# Patient Record
Sex: Female | Born: 1990 | Race: White | Hispanic: No | Marital: Single | Smoking: Never smoker
Health system: Southern US, Community
[De-identification: ages and names within clinical notes are randomized; demographics above are authoritative.]

## PROBLEM LIST (undated history)

## (undated) ENCOUNTER — Inpatient Hospital Stay (HOSPITAL_COMMUNITY): Payer: Self-pay

## (undated) DIAGNOSIS — T7840XA Allergy, unspecified, initial encounter: Secondary | ICD-10-CM

## (undated) DIAGNOSIS — F411 Generalized anxiety disorder: Secondary | ICD-10-CM

## (undated) DIAGNOSIS — E079 Disorder of thyroid, unspecified: Secondary | ICD-10-CM

## (undated) DIAGNOSIS — Z87442 Personal history of urinary calculi: Secondary | ICD-10-CM

## (undated) DIAGNOSIS — F32A Depression, unspecified: Secondary | ICD-10-CM

## (undated) DIAGNOSIS — R569 Unspecified convulsions: Secondary | ICD-10-CM

## (undated) DIAGNOSIS — Q872 Congenital malformation syndromes predominantly involving limbs: Secondary | ICD-10-CM

## (undated) DIAGNOSIS — G4730 Sleep apnea, unspecified: Secondary | ICD-10-CM

## (undated) HISTORY — DX: Depression, unspecified: F32.A

## (undated) HISTORY — DX: Allergy, unspecified, initial encounter: T78.40XA

## (undated) HISTORY — PX: FOOT SURGERY: SHX648

## (undated) HISTORY — PX: COSMETIC SURGERY: SHX468

## (undated) HISTORY — PX: THROAT SURGERY: SHX803

## (undated) HISTORY — DX: Disorder of thyroid, unspecified: E07.9

## (undated) HISTORY — DX: Generalized anxiety disorder: F41.1

## (undated) HISTORY — DX: Unspecified convulsions: R56.9

## (undated) HISTORY — PX: TONSILLECTOMY: SUR1361

---
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
## 2014-04-05 ENCOUNTER — Emergency Department: Payer: Self-pay | Admitting: Emergency Medicine

## 2014-04-05 LAB — COMPREHENSIVE METABOLIC PANEL
ALBUMIN: 3 g/dL — AB (ref 3.4–5.0)
AST: 18 U/L (ref 15–37)
Alkaline Phosphatase: 35 U/L — ABNORMAL LOW
Anion Gap: 11 (ref 7–16)
BILIRUBIN TOTAL: 0.4 mg/dL (ref 0.2–1.0)
BUN: 11 mg/dL (ref 7–18)
CHLORIDE: 107 mmol/L (ref 98–107)
CREATININE: 0.7 mg/dL (ref 0.60–1.30)
Calcium, Total: 8.1 mg/dL — ABNORMAL LOW (ref 8.5–10.1)
Co2: 23 mmol/L (ref 21–32)
EGFR (African American): >60
GLUCOSE: 83 mg/dL (ref 65–99)
OSMOLALITY: 280 (ref 275–301)
POTASSIUM: 4.5 mmol/L (ref 3.5–5.1)
SGPT (ALT): 10 U/L — ABNORMAL LOW
SODIUM: 141 mmol/L (ref 136–145)
TOTAL PROTEIN: 6.3 g/dL — AB (ref 6.4–8.2)

## 2014-04-05 LAB — URINALYSIS, COMPLETE
Bilirubin,UR: NEGATIVE
Blood: NEGATIVE
Glucose,UR: NEGATIVE mg/dL (ref 0–75)
Ketone: NEGATIVE
LEUKOCYTE ESTERASE: NEGATIVE
Nitrite: NEGATIVE
PROTEIN: NEGATIVE
Ph: 6 (ref 4.5–8.0)
RBC,UR: 1 /HPF (ref 0–5)
SPECIFIC GRAVITY: 1.015 (ref 1.003–1.030)
Squamous Epithelial: 4
WBC UR: 1 /HPF (ref 0–5)

## 2014-04-05 LAB — CBC WITH DIFFERENTIAL/PLATELET
BASOS PCT: 0.6 %
Basophil #: 0 10*3/uL (ref 0.0–0.1)
EOS PCT: 1.2 %
Eosinophil #: 0.1 10*3/uL (ref 0.0–0.7)
HCT: 31.6 % — ABNORMAL LOW (ref 35.0–47.0)
HGB: 10.4 g/dL — ABNORMAL LOW (ref 12.0–16.0)
Lymphocyte #: 0.8 10*3/uL — ABNORMAL LOW (ref 1.0–3.6)
Lymphocyte %: 13.4 %
MCH: 31.1 pg (ref 26.0–34.0)
MCHC: 33 g/dL (ref 32.0–36.0)
MCV: 94 fL (ref 80–100)
MONO ABS: 0.4 x10 3/mm (ref 0.2–0.9)
MONOS PCT: 7.1 %
Neutrophil #: 4.4 10*3/uL (ref 1.4–6.5)
Neutrophil %: 77.7 %
PLATELETS: 129 10*3/uL — AB (ref 150–440)
RBC: 3.35 10*6/uL — ABNORMAL LOW (ref 3.80–5.20)
RDW: 16.4 % — AB (ref 11.5–14.5)
WBC: 5.6 10*3/uL (ref 3.6–11.0)

## 2014-04-05 LAB — HCG, QUANTITATIVE, PREGNANCY: Beta Hcg, Quant.: 18624 m[IU]/mL — ABNORMAL HIGH

## 2014-04-05 LAB — LIPASE, BLOOD: Lipase: 101 U/L (ref 73–393)

## 2014-04-07 ENCOUNTER — Encounter: Payer: Self-pay | Admitting: Obstetrics and Gynecology

## 2014-04-22 ENCOUNTER — Observation Stay: Payer: Self-pay | Admitting: Obstetrics and Gynecology

## 2014-05-31 ENCOUNTER — Encounter (HOSPITAL_COMMUNITY): Payer: Self-pay | Admitting: *Deleted

## 2014-05-31 ENCOUNTER — Inpatient Hospital Stay (HOSPITAL_COMMUNITY): Payer: Medicaid Other

## 2014-05-31 ENCOUNTER — Inpatient Hospital Stay (HOSPITAL_COMMUNITY)
Admission: AD | Admit: 2014-05-31 | Discharge: 2014-05-31 | Disposition: A | Discharge status: Home / Self Care | Payer: Medicaid Other | Source: Ambulatory Visit | Attending: Obstetrics & Gynecology | Admitting: Obstetrics & Gynecology

## 2014-05-31 DIAGNOSIS — Q872 Congenital malformation syndromes predominantly involving limbs: Secondary | ICD-10-CM | POA: Diagnosis not present

## 2014-05-31 DIAGNOSIS — O9989 Other specified diseases and conditions complicating pregnancy, childbirth and the puerperium: Secondary | ICD-10-CM | POA: Diagnosis not present

## 2014-05-31 DIAGNOSIS — N200 Calculus of kidney: Secondary | ICD-10-CM | POA: Diagnosis not present

## 2014-05-31 DIAGNOSIS — R109 Unspecified abdominal pain: Secondary | ICD-10-CM | POA: Diagnosis present

## 2014-05-31 HISTORY — DX: Congenital malformation syndromes predominantly involving limbs: Q87.2

## 2014-05-31 HISTORY — DX: Sleep apnea, unspecified: G47.30

## 2014-05-31 LAB — URINALYSIS, ROUTINE W REFLEX MICROSCOPIC
Bilirubin Urine: NEGATIVE
GLUCOSE, UA: NEGATIVE mg/dL
KETONES UR: NEGATIVE mg/dL
Leukocytes, UA: NEGATIVE
Nitrite: NEGATIVE
PROTEIN: NEGATIVE mg/dL
Specific Gravity, Urine: 1.01 (ref 1.005–1.030)
UROBILINOGEN UA: 0.2 mg/dL (ref 0.0–1.0)
pH: 6.5 (ref 5.0–8.0)

## 2014-05-31 LAB — URINE MICROSCOPIC-ADD ON

## 2014-05-31 LAB — CBC
HCT: 29.9 % — ABNORMAL LOW (ref 36.0–46.0)
Hemoglobin: 9.8 g/dL — ABNORMAL LOW (ref 12.0–15.0)
MCH: 31.1 pg (ref 26.0–34.0)
MCHC: 32.8 g/dL (ref 30.0–36.0)
MCV: 94.9 fL (ref 78.0–100.0)
PLATELETS: 122 10*3/uL — AB (ref 150–400)
RBC: 3.15 MIL/uL — ABNORMAL LOW (ref 3.87–5.11)
RDW: 15.6 % — AB (ref 11.5–15.5)
WBC: 9 10*3/uL (ref 4.0–10.5)

## 2014-05-31 MED ORDER — HYDROMORPHONE HCL 1 MG/ML IJ SOLN
1.0000 mg | INTRAMUSCULAR | Status: DC | PRN
  Administered 2014-05-31: 1 mg via INTRAVENOUS
  Filled 2014-05-31: qty 1

## 2014-05-31 MED ORDER — HYDROMORPHONE HCL 2 MG PO TABS: 2.0000 mg | ORAL_TABLET | Freq: Four times a day (QID) | ORAL | Status: DC | PRN

## 2014-05-31 MED ORDER — SODIUM CHLORIDE 0.9 % IV SOLN
Freq: Once | INTRAVENOUS | Status: AC
  Administered 2014-05-31: 14:00:00 via INTRAVENOUS

## 2014-05-31 NOTE — Discharge Instructions (Signed)

## 2014-05-31 NOTE — MAU Note (Signed)
Pt states that at 7am this morning, she had severe sharp pain that started in her back (right side), and then radiated to her stomach. Rates pain 8/10. Pt also felt dizzy and nausated and "spacey".  Denies any LOF and VB. +FM.

## 2014-05-31 NOTE — MAU Provider Note (Signed)
History     CSN: [id number]  Arrival date and time: 05/31/14 1107   First Provider Initiated Contact with Patient 05/31/14 1148      Chief Complaint  Patient presents with  . Back Pain  . Abdominal Pain   Back Pain Associated symptoms include abdominal pain (intermittent).  Abdominal Pain    Pt is 23 yo G1P0 at [redacted]w[redacted]d wks IUP here with report of sharp pain in abdomen at 7 am today.  Pain t started in her back (right side), and then radiated to her stomach. Rates pain 8/10. Pt also felt dizzy and nausated and "spacey". Denies any LOF and VB. +FM.     Pt states that adnexal cyst found on renal ultrasound was found at [facility] and they're going to follow-up later in pregnancy.  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    Past Medical History  Diagnosis Date  . Klippel-Trenaunay-Weber syndrome   . Sleep apnea     Past Surgical History  Procedure Laterality Date  . Tonsillectomy    . Foot surgery Bilateral   . Throat surgery      History reviewed. No pertinent family history.  History  Substance Use Topics  . Smoking status: Never Smoker   . Smokeless tobacco: Not on file  . Alcohol Use: No    Allergies: No Known Allergies  Prescriptions prior to admission  Medication Sig Dispense Refill  . enoxaparin (LOVENOX) 30 MG/0.3ML injection Inject 30 mg into the skin daily.      . ferrous sulfate 325 (65 FE) MG tablet Take 325 mg by mouth daily with breakfast.      . Prenatal Vit-Fe Fumarate-FA (PRENATAL MULTIVITAMIN) TABS tablet Take 1 tablet by mouth daily at 12 noon.        Review of Systems  Gastrointestinal: Positive for abdominal pain (intermittent).  Musculoskeletal: Positive for back pain (constant).  All other systems reviewed and are negative.  Physical Exam   Blood pressure 100/62, pulse 88, temperature 98.6 F (37 C), temperature source Oral, resp. rate 20, height 5\' 2"  (1.575 m), weight 77.111 kg (170  lb).  Physical Exam  Constitutional: She is oriented to person, place, and time. She appears well-developed and well-nourished. She appears distressed (appears uncomfortable).  HENT:  Head: Normocephalic.  Neck: Normal range of motion. Neck supple.  Cardiovascular: Normal rate, regular rhythm and normal heart sounds.   Respiratory: Effort normal and breath sounds normal.  GI: Soft. There is no tenderness.  Genitourinary: No bleeding around the vagina. Vaginal discharge (mucusy) found.  Musculoskeletal: Normal range of motion.  Neurological: She is alert and oriented to person, place, and time.  Skin: Skin is warm and dry.   FHR 130's, +accels Toco - none  MAU Course  Procedures Results for orders placed during the hospital encounter of 05/31/14 (from the past 24 hour(s))  URINALYSIS, ROUTINE W REFLEX MICROSCOPIC     Status: Abnormal   Collection Time    05/31/14 12:00 PM      Result Value Ref Range   Color, Urine YELLOW  YELLOW   APPearance CLEAR  CLEAR   Specific Gravity, Urine 1.010  1.005 - 1.030   pH 6.5  5.0 - 8.0   Glucose, UA NEGATIVE  NEGATIVE mg/dL   Hgb urine dipstick LARGE (*) NEGATIVE   Bilirubin Urine NEGATIVE  NEGATIVE   Ketones, ur NEGATIVE  NEGATIVE mg/dL   Protein, ur NEGATIVE  NEGATIVE mg/dL   Urobilinogen, UA 0.2  0.0 - 1.0 mg/dL   Nitrite NEGATIVE  NEGATIVE   Leukocytes, UA NEGATIVE  NEGATIVE  URINE MICROSCOPIC-ADD ON     Status: None   Collection Time    05/31/14 12:00 PM      Result Value Ref Range   Squamous Epithelial / LPF RARE  RARE   WBC, UA 0-2  <3 WBC/hpf   RBC / HPF 7-10  <3 RBC/hpf  CBC     Status: Abnormal   Collection Time    05/31/14 12:06 PM      Result Value Ref Range   WBC 9.0  4.0 - 10.5 K/uL   RBC 3.15 (*) 3.87 - 5.11 MIL/uL   Hemoglobin 9.8 (*) 12.0 - 15.0 g/dL   HCT 16.129.9 (*) 09.636.0 - 04.546.0 %   MCV 94.9  78.0 - 100.0 fL   MCH 31.1  26.0 - 34.0 pg   MCHC 32.8  30.0 - 36.0 g/dL   RDW 40.915.6 (*) 81.111.5 - 91.415.5 %   Platelets 122 (*)  150 - 400 K/uL   EXAM:  RENAL/URINARY TRACT ULTRASOUND COMPLETE  COMPARISON: None.  FINDINGS:  Right Kidney:  Length: 13.6 cm. 4 mm midpole stone without obstruction.  Echogenicity within normal limits. No mass or hydronephrosis  visualized.  Left Kidney:  Length: 11.6 cm. Echogenicity within normal limits. No mass or  hydronephrosis visualized.  Bladder:  Appears normal for degree of bladder distention. Bilateral ureteral  jets are visualized by Doppler.  Enlarged spleen measuring 16.5 cm in length.  Gravid uterus. 9.7 cm cyst in the left adnexal lateral to the  bladder is predominately anechoic.  IMPRESSION:  4 mm nonobstructing midpole stone on the right. Both kidneys appear  normal  Splenomegaly  9.7 cm left adnexal cyst.  1500 Pt reports passing two small stones in toilet while urinating; pain slightly relieved  Pt received 1 L NS and 1 mg IV dilaudid > reports relief  1640 Reviewed HPI/exam/medical history/ultrasound/labs with Dr. 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 Schedule appointment with [facility] this week.  Rochele PagesKARIM, WALIDAH N 05/31/2014, 11:48 AM

## 2014-06-18 ENCOUNTER — Encounter (HOSPITAL_COMMUNITY): Payer: Self-pay | Admitting: *Deleted

## 2014-06-18 ENCOUNTER — Inpatient Hospital Stay (HOSPITAL_COMMUNITY)
Admission: AD | Admit: 2014-06-18 | Discharge: 2014-06-19 | Disposition: A | Discharge status: Home / Self Care | Payer: Medicaid Other | Source: Ambulatory Visit | Attending: Obstetrics & Gynecology | Admitting: Obstetrics & Gynecology

## 2014-06-18 ENCOUNTER — Emergency Department: Payer: Self-pay | Admitting: Emergency Medicine

## 2014-06-18 DIAGNOSIS — K921 Melena: Secondary | ICD-10-CM

## 2014-06-18 DIAGNOSIS — O468X3 Other antepartum hemorrhage, third trimester: Secondary | ICD-10-CM | POA: Insufficient documentation

## 2014-06-18 DIAGNOSIS — O99891 Other specified diseases and conditions complicating pregnancy: Secondary | ICD-10-CM

## 2014-06-18 DIAGNOSIS — M549 Dorsalgia, unspecified: Secondary | ICD-10-CM

## 2014-06-18 DIAGNOSIS — Z3A29 29 weeks gestation of pregnancy: Secondary | ICD-10-CM | POA: Diagnosis not present

## 2014-06-18 DIAGNOSIS — O9989 Other specified diseases and conditions complicating pregnancy, childbirth and the puerperium: Secondary | ICD-10-CM

## 2014-06-18 DIAGNOSIS — Q872 Congenital malformation syndromes predominantly involving limbs: Secondary | ICD-10-CM

## 2014-06-18 LAB — CBC
HCT: 32.1 % — AB (ref 35.0–47.0)
HCT: 30.1 % — ABNORMAL LOW (ref 35.0–47.0)
HCT: 30.7 % — ABNORMAL LOW (ref 36.0–46.0)
HEMOGLOBIN: 10 g/dL — AB (ref 12.0–15.0)
HGB: 10.6 g/dL — ABNORMAL LOW (ref 12.0–16.0)
HGB: 9.9 g/dL — ABNORMAL LOW (ref 12.0–16.0)
MCH: 31.6 pg (ref 26.0–34.0)
MCH: 31.4 pg (ref 26.0–34.0)
MCH: 31.6 pg (ref 26.0–34.0)
MCHC: 32.9 g/dL (ref 32.0–36.0)
MCHC: 32.8 g/dL (ref 32.0–36.0)
MCHC: 32.6 g/dL (ref 30.0–36.0)
MCV: 96 fL (ref 80–100)
MCV: 96 fL (ref 80–100)
MCV: 97.2 fL (ref 78.0–100.0)
PLATELETS: 124 10*3/uL — AB (ref 150–440)
Platelet: 121 10*3/uL — ABNORMAL LOW (ref 150–440)
Platelets: 133 10*3/uL — ABNORMAL LOW (ref 150–400)
RBC: 3.34 10*6/uL — ABNORMAL LOW (ref 3.80–5.20)
RBC: 3.15 10*6/uL — AB (ref 3.80–5.20)
RBC: 3.16 MIL/uL — ABNORMAL LOW (ref 3.87–5.11)
RDW: 15.9 % — ABNORMAL HIGH (ref 11.5–14.5)
RDW: 16.2 % — AB (ref 11.5–14.5)
RDW: 16.7 % — ABNORMAL HIGH (ref 11.5–15.5)
WBC: 6.9 10*3/uL (ref 3.6–11.0)
WBC: 6.8 10*3/uL (ref 3.6–11.0)
WBC: 7.6 10*3/uL (ref 4.0–10.5)

## 2014-06-18 LAB — URINALYSIS, ROUTINE W REFLEX MICROSCOPIC
BILIRUBIN URINE: NEGATIVE
GLUCOSE, UA: NEGATIVE mg/dL
Hgb urine dipstick: NEGATIVE
KETONES UR: 15 mg/dL — AB
Leukocytes, UA: NEGATIVE
Nitrite: NEGATIVE
Protein, ur: NEGATIVE mg/dL
Specific Gravity, Urine: 1.025 (ref 1.005–1.030)
Urobilinogen, UA: 0.2 mg/dL (ref 0.0–1.0)
pH: 6.5 (ref 5.0–8.0)

## 2014-06-18 LAB — HCG, QUANTITATIVE, PREGNANCY: Beta Hcg, Quant.: 18233 m[IU]/mL — ABNORMAL HIGH

## 2014-06-18 MED ORDER — TRAMADOL HCL 50 MG PO TABS
100.0000 mg | ORAL_TABLET | Freq: Once | ORAL | Status: AC
  Administered 2014-06-19: 100 mg via ORAL
  Filled 2014-06-18: qty 2

## 2014-06-18 NOTE — MAU Note (Addendum)
Last night had BM and passed a lot of blood rectally. Happened again about 1000 this am. WEnt to [place] about 1400 and there til 2030. WEnt to BR at home and BM and it was black. Having a lot of abd and back pain. I have rare syndrome and am [facility].  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 Was here couple wks ago for kidney stones. Passes some before i was seen but told had another one to pass but never did as far as i know.

## 2014-06-19 DIAGNOSIS — Q872 Congenital malformation syndromes predominantly involving limbs: Secondary | ICD-10-CM

## 2014-06-19 DIAGNOSIS — O26893 Other specified pregnancy related conditions, third trimester: Secondary | ICD-10-CM

## 2014-06-19 DIAGNOSIS — M549 Dorsalgia, unspecified: Secondary | ICD-10-CM

## 2014-06-19 MED ORDER — ONDANSETRON HCL 4 MG PO TABS: 4.0000 mg | ORAL_TABLET | Freq: Three times a day (TID) | ORAL | Status: DC | PRN

## 2014-06-19 MED ORDER — TRAMADOL HCL 50 MG PO TABS: 50.0000 mg | ORAL_TABLET | Freq: Four times a day (QID) | ORAL | Status: DC | PRN

## 2014-06-19 MED ORDER — ONDANSETRON 4 MG PO TBDP
4.0000 mg | ORAL_TABLET | Freq: Once | ORAL | Status: AC
  Administered 2014-06-19: 4 mg via ORAL
  Filled 2014-06-19: qty 1

## 2014-06-19 NOTE — Discharge Instructions (Signed)
Third Trimester of Pregnancy The third trimester is from week 29 through week 42, months 7 through 9. The third trimester is a time when the fetus is growing rapidly. At the end of the ninth month, the fetus is about 20 inches in length and weighs 6-10 pounds.  BODY CHANGES Your body goes through many changes during pregnancy. The changes vary from woman to woman.   Your weight will continue to increase. You can expect to gain 25-35 pounds (11-16 kg) by the end of the pregnancy.  You may begin to get stretch marks on your hips, abdomen, and breasts.  You may urinate more often because the fetus is moving lower into your pelvis and pressing on your bladder.  You may develop or continue to have heartburn as a result of your pregnancy.  You may develop constipation because certain hormones are causing the muscles that push waste through your intestines to slow down.  You may develop hemorrhoids or swollen, bulging veins (varicose veins).  You may have pelvic pain because of the weight gain and pregnancy hormones relaxing your joints between the bones in your pelvis. Backaches may result from overexertion of the muscles supporting your posture.  You may have changes in your hair. These can include thickening of your hair, rapid growth, and changes in texture. Some women also have hair loss during or after pregnancy, or hair that feels dry or thin. Your hair will most likely return to normal after your baby is born.  Your breasts will continue to grow and be tender. A yellow discharge may leak from your breasts called colostrum.  Your belly button may stick out.  You may feel short of breath because of your expanding uterus.  You may notice the fetus "dropping," or moving lower in your abdomen.  You may have a bloody mucus discharge. This usually occurs a few days to a week before labor begins.  Your cervix becomes thin and soft (effaced) near your due date. WHAT TO EXPECT AT YOUR PRENATAL  EXAMS  You will have prenatal exams every 2 weeks until week 36. Then, you will have weekly prenatal exams. During a routine prenatal visit:  You will be weighed to make sure you and the fetus are growing normally.  Your blood pressure is taken.  Your abdomen will be measured to track your baby's growth.  The fetal heartbeat will be listened to.  Any test results from the previous visit will be discussed.  You may have a cervical check near your due date to see if you have effaced. At around 36 weeks, your caregiver will check your cervix. At the same time, your caregiver will also perform a test on the secretions of the vaginal tissue. This test is to determine if a type of bacteria, Group B streptococcus, is present. Your caregiver will explain this further. Your caregiver may ask you:  What your birth plan is.  How you are feeling.  If you are feeling the baby move.  If you have had any abnormal symptoms, such as leaking fluid, bleeding, severe headaches, or abdominal cramping.  If you have any questions. Other tests or screenings that may be performed during your third trimester include:  Blood tests that check for low iron levels (anemia).  Fetal testing to check the health, activity level, and growth of the fetus. Testing is done if you have certain medical conditions or if there are problems during the pregnancy. FALSE LABOR You may feel small, irregular contractions that   eventually go away. These are called Braxton Hicks contractions, or false labor. Contractions may last for hours, days, or even weeks before true labor sets in. If contractions come at regular intervals, intensify, or become painful, it is best to be seen by your caregiver.  SIGNS OF LABOR   Menstrual-like cramps.  Contractions that are 5 minutes apart or less.  Contractions that start on the top of the uterus and spread down to the lower abdomen and back.  A sense of increased pelvic pressure or back  pain.  A watery or bloody mucus discharge that comes from the vagina. If you have any of these signs before the 37th week of pregnancy, call your caregiver right away. You need to go to the hospital to get checked immediately. HOME CARE INSTRUCTIONS   Avoid all smoking, herbs, alcohol, and unprescribed drugs. These chemicals affect the formation and growth of the baby.  Follow your caregiver's instructions regarding medicine use. There are medicines that are either safe or unsafe to take during pregnancy.  Exercise only as directed by your caregiver. Experiencing uterine cramps is a good sign to stop exercising.  Continue to eat regular, healthy meals.  Wear a good support bra for breast tenderness.  Do not use hot tubs, steam rooms, or saunas.  Wear your seat belt at all times when driving.  Avoid raw meat, uncooked cheese, cat litter boxes, and soil used by cats. These carry germs that can cause birth defects in the baby.  Take your prenatal vitamins.  Try taking a stool softener (if your caregiver approves) if you develop constipation. Eat more high-fiber foods, such as fresh vegetables or fruit and whole grains. Drink plenty of fluids to keep your urine clear or pale yellow.  Take warm sitz baths to soothe any pain or discomfort caused by hemorrhoids. Use hemorrhoid cream if your caregiver approves.  If you develop varicose veins, wear support hose. Elevate your feet for 15 minutes, 3-4 times a day. Limit salt in your diet.  Avoid heavy lifting, wear low heal shoes, and practice good posture.  Rest a lot with your legs elevated if you have leg cramps or low back pain.  Visit your dentist if you have not gone during your pregnancy. Use a soft toothbrush to brush your teeth and be gentle when you floss.  A sexual relationship may be continued unless your caregiver directs you otherwise.  Do not travel far distances unless it is absolutely necessary and only with the approval  of your caregiver.  Take prenatal classes to understand, practice, and ask questions about the labor and delivery.  Make a trial run to the hospital.  Pack your hospital bag.  Prepare the baby's nursery.  Continue to go to all your prenatal visits as directed by your caregiver. SEEK MEDICAL CARE IF:  You are unsure if you are in labor or if your water has broken.  You have dizziness.  You have mild pelvic cramps, pelvic pressure, or nagging pain in your abdominal area.  You have persistent nausea, vomiting, or diarrhea.  You have a bad smelling vaginal discharge.  You have pain with urination. SEEK IMMEDIATE MEDICAL CARE IF:   You have a fever.  You are leaking fluid from your vagina.  You have spotting or bleeding from your vagina.  You have severe abdominal cramping or pain.  You have rapid weight loss or gain.  You have shortness of breath with chest pain.  You notice sudden or extreme swelling   of your face, hands, ankles, feet, or legs.  You have not felt your baby move in over an hour.  You have severe headaches that do not go away with medicine.  You have vision changes. Document Released: 08/06/2001 Document Revised: 08/17/2013 Document Reviewed: 10/13/2012 Exit[employer] Patient Information 2015 [employer]. This information is not intended to replace advice given to you by your health care provider. Make sure you discuss any questions you have with your health care provider.  

## 2014-06-19 NOTE — MAU Provider Note (Signed)
History     CSN: [id number]  Arrival date and time: 06/18/14 2209   First Provider Initiated Contact with Patient 06/18/14 2312      Chief Complaint  Patient presents with  . Rectal Bleeding  . Back Pain  . Abdominal Pain   HPI   Frances Reed is a 23 y.o. G1P0 at 6345w0d who presents with rectal bleeding and low back pain. Patient presented to [facility] hospital for rectal bleeding earlier in the day, was diagnosed with hemorrhoidal bleeding and discharged home. At home she had another bowel movement with "darker" blood and presented to the MAU. Of note, patient is currently on Lovenox and has been diagnosed with Klippel-Trenaunay-Weber syndrome and [facility]. Also reports some dizziness when standing now.  She additionally reports sharp, constant back pain that started around 3 hours prior to arrival. She took some tylenol which did not help. Pain is currently an 8/10 and described as somewhat similar to her previous kidney stones diagnosed 2 weeks ago.  Denies any vaginal bleeding. No LOF. No contractions. Good fetal movement.   OB History   Grav Para Term Preterm Abortions TAB SAB Ect Mult Living   1               Past Medical History  Diagnosis Date  . Klippel-Trenaunay-Weber syndrome   . Sleep apnea     Past Surgical History  Procedure Laterality Date  . Tonsillectomy    . Foot surgery Bilateral   . Throat surgery      History reviewed. No pertinent family history.  History  Substance Use Topics  . Smoking status: Never Smoker   . Smokeless tobacco: Not on file  . Alcohol Use: No    Allergies: No Known Allergies  Prescriptions prior to admission  Medication Sig Dispense Refill  . enoxaparin (LOVENOX) 30 MG/0.3ML injection Inject 30 mg into the skin daily.      . ferrous sulfate 325 (65 FE) MG tablet Take 325 mg by mouth daily with breakfast.      . HYDROmorphone (DILAUDID) 2 MG tablet Take 1 tablet (2 mg total) by mouth every 6  (six) hours as needed for severe pain.  15 tablet  0  . Prenatal Vit-Fe Fumarate-FA (PRENATAL MULTIVITAMIN) TABS tablet Take 1 tablet by mouth daily at 12 noon.        Review of Systems  All other systems reviewed and are negative.  Physical Exam   Blood pressure 96/63, pulse 79, resp. rate 20, height 5\' 2"  (1.575 m), weight 84.46 kg (186 lb 3.2 oz), SpO2 94.00%.  Physical Exam  Nursing note and vitals reviewed. Constitutional: She is oriented to person, place, and time. She appears well-developed and well-nourished. No distress.  HENT:  Head: Normocephalic and atraumatic.  Eyes: EOM are normal.  Neck: Normal range of motion.  Cardiovascular: Regular rhythm and normal heart sounds.   No murmur heard. Respiratory: Effort normal and breath sounds normal. No respiratory distress. She has no wheezes.  GI: Soft. There is no tenderness.  Uterine size appropriate for gestational age  Musculoskeletal: Normal range of motion.  Neurological: She is alert and oriented to person, place, and time.  Skin: Skin is warm and dry.  FHT:  FHR: 130 bpm, variability: moderate,  accelerations:  present,  decelerations:  none   Orthostatic VS for the past 24 hrs:  BP- Lying Pulse- Lying BP- Sitting Pulse- Sitting BP- Standing at 0 minutes Pulse- Standing at  0 minutes  06/18/14 2328 110/60 mmHg 90 106/61 mmHg 95 96/63 mmHg 133      Procedures-None  Results for orders placed during the hospital encounter of 06/18/14 (from the past 24 hour(s))  URINALYSIS, ROUTINE W REFLEX MICROSCOPIC     Status: Abnormal   Collection Time    06/18/14 10:35 PM      Result Value Ref Range   Color, Urine YELLOW  YELLOW   APPearance CLEAR  CLEAR   Specific Gravity, Urine 1.025  1.005 - 1.030   pH 6.5  5.0 - 8.0   Glucose, UA NEGATIVE  NEGATIVE mg/dL   Hgb urine dipstick NEGATIVE  NEGATIVE   Bilirubin Urine NEGATIVE  NEGATIVE   Ketones, ur 15 (*) NEGATIVE mg/dL   Protein, ur NEGATIVE  NEGATIVE mg/dL    Urobilinogen, UA 0.2  0.0 - 1.0 mg/dL   Nitrite NEGATIVE  NEGATIVE   Leukocytes, UA NEGATIVE  NEGATIVE  CBC     Status: Abnormal   Collection Time    06/18/14 11:25 PM      Result Value Ref Range   WBC 7.6  4.0 - 10.5 K/uL   RBC 3.16 (*) 3.87 - 5.11 MIL/uL   Hemoglobin 10.0 (*) 12.0 - 15.0 g/dL   HCT 53.630.7 (*) 64.436.0 - 03.446.0 %   MCV 97.2  78.0 - 100.0 fL   MCH 31.6  26.0 - 34.0 pg   MCHC 32.6  30.0 - 36.0 g/dL   RDW 74.216.7 (*) 59.511.5 - 63.815.5 %   Platelets 133 (*) 150 - 400 K/uL     MDM 0000: vitals currently within normal ranges. FHT category 1. Will obtain orthostatic vitals, CBC, and UA. Will give tramadol for back pain. Will encourage PO intake.  0200: Patient nauseated with PO challenge, will give PO zofran  Assessment and Plan  A: 238w0d IUP. FHT category 1. Blood per rectum likely hemorrhoidal bleeding. HgB stable. UA positive for ketones otherwise unremarkable. Pain responsive to tramadol and patient feeling subjectively better after PO challenge and zofran in MAU.  P: Discharge home in stable condition, will follow up with [facility] Prescriptions given for tramadol and zofran Preterm labor precautions and kickcounts reviewed.   Jacquiline Doearker, Caleb 06/19/2014, 3:16 AM   I spoke with and examined patient and agree with resident/PA/SNM's note and plan of care.  Cheral MarkerKimberly R. Pharoah Goggins, CNM, WHNP-BC 06/19/2014 4:40 AM

## 2014-06-28 ENCOUNTER — Encounter (HOSPITAL_COMMUNITY): Payer: Self-pay | Admitting: *Deleted

## 2014-10-17 ENCOUNTER — Emergency Department (HOSPITAL_COMMUNITY)
Admission: EM | Admit: 2014-10-17 | Discharge: 2014-10-17 | Discharge status: Against Medical Advice | Payer: Medicaid Other | Attending: Emergency Medicine | Admitting: Emergency Medicine

## 2014-10-17 ENCOUNTER — Encounter (HOSPITAL_COMMUNITY): Payer: Self-pay | Admitting: *Deleted

## 2014-10-17 DIAGNOSIS — R109 Unspecified abdominal pain: Secondary | ICD-10-CM | POA: Insufficient documentation

## 2014-10-17 DIAGNOSIS — N939 Abnormal uterine and vaginal bleeding, unspecified: Secondary | ICD-10-CM | POA: Insufficient documentation

## 2014-10-17 NOTE — ED Notes (Signed)
Called patient 3 more times. Patient not in lobby. Patient presumed to have left without being seen.

## 2014-10-17 NOTE — ED Notes (Signed)
Patient not in lobby, called x 4 in lobby. Unable to obtain vital signs at this time.

## 2014-10-17 NOTE — ED Notes (Signed)
Per ems pt reports delivered baby 7 weeks ago, today pt started having intermittent abdominal pain/ cramping/ vaginal pain x2 hours. Pain 8/10.
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
## 2015-01-18 DIAGNOSIS — G5600 Carpal tunnel syndrome, unspecified upper limb: Secondary | ICD-10-CM | POA: Insufficient documentation

## 2015-02-04 DIAGNOSIS — R1032 Left lower quadrant pain: Secondary | ICD-10-CM | POA: Diagnosis not present

## 2015-02-05 ENCOUNTER — Emergency Department
Admission: EM | Admit: 2015-02-05 | Discharge: 2015-02-05 | Discharge status: Against Medical Advice | Payer: Medicaid Other | Attending: Student | Admitting: Student

## 2015-02-05 ENCOUNTER — Encounter: Payer: Self-pay | Admitting: Emergency Medicine

## 2015-02-05 LAB — CBC WITH DIFFERENTIAL/PLATELET
BASOS ABS: 0.1 10*3/uL (ref 0–0.1)
Basophils Relative: 1 %
EOS PCT: 3 %
Eosinophils Absolute: 0.2 10*3/uL (ref 0–0.7)
HCT: 37.3 % (ref 35.0–47.0)
HEMOGLOBIN: 12.7 g/dL (ref 12.0–16.0)
LYMPHS ABS: 1.7 10*3/uL (ref 1.0–3.6)
Lymphocytes Relative: 27 %
MCH: 29.6 pg (ref 26.0–34.0)
MCHC: 34 g/dL (ref 32.0–36.0)
MCV: 86.8 fL (ref 80.0–100.0)
MONO ABS: 0.4 10*3/uL (ref 0.2–0.9)
MONOS PCT: 7 %
NEUTROS ABS: 3.9 10*3/uL (ref 1.4–6.5)
Neutrophils Relative %: 62 %
Platelets: 249 10*3/uL (ref 150–440)
RBC: 4.3 MIL/uL (ref 3.80–5.20)
RDW: 15.1 % — ABNORMAL HIGH (ref 11.5–14.5)
WBC: 6.3 10*3/uL (ref 3.6–11.0)

## 2015-02-05 LAB — COMPREHENSIVE METABOLIC PANEL
ALBUMIN: 4.1 g/dL (ref 3.5–5.0)
ALT: 13 U/L — ABNORMAL LOW (ref 14–54)
AST: 18 U/L (ref 15–41)
Alkaline Phosphatase: 50 U/L (ref 38–126)
Anion gap: 8 (ref 5–15)
BUN: 13 mg/dL (ref 6–20)
CALCIUM: 9 mg/dL (ref 8.9–10.3)
CHLORIDE: 105 mmol/L (ref 101–111)
CO2: 23 mmol/L (ref 22–32)
Creatinine, Ser: 0.99 mg/dL (ref 0.44–1.00)
GFR calc Af Amer: >60 mL/min (ref >60)
GFR calc non Af Amer: >60 mL/min (ref >60)
Glucose, Bld: 86 mg/dL (ref 65–99)
Potassium: 4.3 mmol/L (ref 3.5–5.1)
SODIUM: 136 mmol/L (ref 135–145)
Total Bilirubin: 0.5 mg/dL (ref 0.3–1.2)
Total Protein: 6.6 g/dL (ref 6.5–8.1)

## 2015-02-05 NOTE — ED Notes (Signed)
Pt unable to void at this time, given cup and asked to provide sample when able.

## 2015-02-05 NOTE — ED Notes (Signed)
Called for room, no answer

## 2015-02-05 NOTE — ED Notes (Signed)
Pt presents to ER alert and in NAD. Pt states she is having LLQ pain after lifting someone heavy.

## 2015-06-09 ENCOUNTER — Telehealth: Payer: Self-pay | Admitting: Family Medicine

## 2015-06-09 NOTE — Telephone Encounter (Signed)
[facility] [address] [facility] a [facility] on Paroxetine. Patient last seen on 01-12-15

## 2015-06-09 NOTE — Telephone Encounter (Signed)
Routed to Dr. Shah for approval 

## 2015-06-09 NOTE — Telephone Encounter (Signed)
Patient will need an appointment for medication refills.  

## 2015-06-12 NOTE — Telephone Encounter (Signed)
Patient has an appointment scheduled for 06/13/2015

## 2015-06-13 ENCOUNTER — Encounter: Payer: Self-pay | Admitting: Family Medicine

## 2015-06-13 ENCOUNTER — Ambulatory Visit (INDEPENDENT_AMBULATORY_CARE_PROVIDER_SITE_OTHER): Payer: Medicaid Other | Admitting: Family Medicine

## 2015-06-13 VITALS — BP 118/82 | HR 110 | Temp 98.2°F | Resp 20 | Ht 62.0 in | Wt 176.1 lb

## 2015-06-13 DIAGNOSIS — Z23 Encounter for immunization: Secondary | ICD-10-CM | POA: Diagnosis not present

## 2015-06-13 DIAGNOSIS — F411 Generalized anxiety disorder: Secondary | ICD-10-CM | POA: Insufficient documentation

## 2015-06-13 DIAGNOSIS — G5602 Carpal tunnel syndrome, left upper limb: Secondary | ICD-10-CM

## 2015-06-13 HISTORY — DX: Generalized anxiety disorder: F41.1

## 2015-06-13 MED ORDER — PAROXETINE HCL 20 MG PO TABS: 20.0000 mg | ORAL_TABLET | Freq: Every day | ORAL | Status: DC

## 2015-06-13 MED ORDER — CLONAZEPAM 0.25 MG PO TBDP: 0.2500 mg | ORAL_TABLET | Freq: Two times a day (BID) | ORAL | Status: DC

## 2015-06-13 NOTE — Progress Notes (Signed)
Name: Frances Reed   MRN: [id number]    DOB: [birth year]   Date:06/13/2015       Progress Note  Subjective  Chief Complaint  Chief Complaint  Patient presents with  . Medication Refill    Anxiety Presents for follow-up visit. Onset was 1 to 5 years ago. Symptoms include excessive worry, irritability, nervous/anxious behavior, palpitations and restlessness. Patient reports no depressed mood, insomnia or panic. Symptoms occur most days. The severity of symptoms is severe.   Her past medical history is significant for depression. Past treatments include SSRIs. The treatment provided moderate (would like to try something for anxiety/panic attacks that she can take 'as needed') relief.   Pt. Also requesting a referral to have surgery for Carpal Tunnel. She has been told by her neurologist that she will need surgery for Carpal Tunnel soon due to nerve damage in her left wrist.   Past Medical History  Diagnosis Date  . Klippel-Trenaunay-Weber syndrome   . Sleep apnea     Past Surgical History  Procedure Laterality Date  . Tonsillectomy    . Foot surgery Bilateral   . Throat surgery      History reviewed. No pertinent family history.  Social History   Social History  . Marital Status: Single    Spouse Name: N/A  . Number of Children: N/A  . Years of Education: N/A   Occupational History  . Not on file.   Social History Main Topics  . Smoking status: Never Smoker   . Smokeless tobacco: Not on file  . Alcohol Use: No  . Drug Use: No  . Sexual Activity:    Partners: Female    Copy: None   Other Topics Concern  . Not on file   Social History Narrative    Current outpatient prescriptions:  .  enoxaparin (LOVENOX) 30 MG/0.3ML injection, Inject 30 mg into the skin daily., Disp: , Rfl:  .  gabapentin (NEURONTIN) 100 MG capsule, Take 1 capsule by mouth 2 (two) times daily., Disp: , Rfl: 4 .  PARoxetine (PAXIL) 20 MG tablet, Take 1 tablet by mouth at  bedtime., Disp: , Rfl:   Allergies  Allergen Reactions  . Motrin [Ibuprofen] Other (See Comments)    Klippel Trenauney Syndrome "Doesn't like to take it"   Review of Systems  Constitutional: Positive for irritability.  Cardiovascular: Positive for palpitations.  Neurological: Positive for tingling (numbness in left arm).  Psychiatric/Behavioral: Negative for depression. The patient is nervous/anxious. The patient does not have insomnia.    Objective  Filed Vitals:   06/13/15 1114  BP: 118/82  Pulse: 110  Temp: 98.2 F (36.8 C)  TempSrc: Oral  Resp: 20  Height:  (1.575 m)  Weight: 176 lb 1.6 oz (79.878 kg)  SpO2: 98%    Physical Exam  Constitutional: She is oriented to person, place, and time and well-developed, well-nourished, and in no distress.  Cardiovascular: Normal rate, regular rhythm and normal heart sounds.   Pulmonary/Chest: Breath sounds normal. She has no wheezes. She has no rales.  Neurological: She is alert and oriented to person, place, and time.  Skin: Skin is warm and dry.  Psychiatric: Memory, affect and judgment normal. Her mood appears anxious. She does not exhibit a depressed mood. She expresses no suicidal ideation. She expresses no suicidal plans.  Nursing note and vitals reviewed.  Assessment & Plan  1. Need for immunization against influenza  - Flu Vaccine QUAD 36+ mos PF IM (Fluarix & Fluzone  Quad PF)  2. Generalized anxiety disorder Started patient on clonazepam 0.25 mg twice a day as needed for acute attacks of anxiety. Patient educated on the dependence potential of benzodiazepines, the side effects, and asked to take the medication only as needed and as directed. Continue on Paxil 20 mg daily. Follow-up in one month. - clonazePAM (KLONOPIN) 0.25 MG disintegrating tablet; Take 1 tablet (0.25 mg total) by mouth 2 (two) times daily.  Dispense: 60 tablet; Refill: 0 - PARoxetine (PAXIL) 20 MG tablet; Take 1 tablet (20 mg total) by mouth at  bedtime.  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 Faylene KurtzShah [facility] 06/13/2015 11:28 AM

## 2015-06-19 ENCOUNTER — Telehealth: Payer: Self-pay

## 2015-06-19 NOTE — Telephone Encounter (Signed)
Called pt, had to leave a message in reference to a PA for her Clonazepam 0.25 ODT, Medicaid couldn't approve [facility], will take 24 hrs

## 2015-06-22 NOTE — Telephone Encounter (Signed)
Called pt, had to leave a message, Medicaid approved her Clonazepam ODT. I have notified the pharmacy for her and they will fill it.

## 2015-07-14 ENCOUNTER — Ambulatory Visit (INDEPENDENT_AMBULATORY_CARE_PROVIDER_SITE_OTHER): Payer: Medicaid Other | Admitting: Family Medicine

## 2015-07-14 ENCOUNTER — Encounter: Payer: Self-pay | Admitting: Family Medicine

## 2015-07-14 VITALS — BP 118/77 | HR 115 | Temp 98.4°F | Resp 19 | Ht 62.0 in | Wt 175.4 lb

## 2015-07-14 DIAGNOSIS — F411 Generalized anxiety disorder: Secondary | ICD-10-CM

## 2015-07-14 MED ORDER — CLONAZEPAM 0.25 MG PO TBDP: 0.2500 mg | ORAL_TABLET | Freq: Two times a day (BID) | ORAL | Status: DC

## 2015-07-14 NOTE — Progress Notes (Signed)
Name: Frances Reed   MRN: [id number]    DOB: 10/27/1990   Date:07/14/2015       Progress Note  Subjective  Chief Complaint  Chief Complaint  Patient presents with  . Follow-up    1 mo  . Anxiety    Anxiety Presents for follow-up visit. The problem has been gradually improving. Symptoms include excessive worry, irritability, nervous/anxious behavior and restlessness. Patient reports no depressed mood, insomnia, palpitations or panic. Symptoms occur most days. The severity of symptoms is severe. The quality of sleep is good.   Her past medical history is significant for depression. Past treatments include SSRIs and benzodiazephines. The treatment provided significant (would like to try something for anxiety/panic attacks that she can take 'as needed') relief. Compliance with prior treatments has been good.    Past Medical History  Diagnosis Date  . Klippel-Trenaunay-Weber syndrome   . Sleep apnea   . Generalized anxiety disorder 06/13/2015    Past Surgical History  Procedure Laterality Date  . Tonsillectomy    . Foot surgery Bilateral   . Throat surgery      History reviewed. No pertinent family history.  Social History   Social History  . Marital Status: Single    Spouse Name: N/A  . Number of Children: N/A  . Years of Education: N/A   Occupational History  . Not on file.   Social History Main Topics  . Smoking status: Never Smoker   . Smokeless tobacco: Not on file  . Alcohol Use: No  . Drug Use: No  . Sexual Activity:    Partners: Female    Copy: None   Other Topics Concern  . Not on file   Social History Narrative     Current outpatient prescriptions:  .  clonazePAM (KLONOPIN) 0.25 MG disintegrating tablet, Take 1 tablet (0.25 mg total) by mouth 2 (two) times daily., Disp: 60 tablet, Rfl: 0 .  enoxaparin (LOVENOX) 30 MG/0.3ML injection, Inject 30 mg into the skin daily., Disp: , Rfl:  .  gabapentin (NEURONTIN) 100 MG capsule,  Take 1 capsule by mouth 2 (two) times daily., Disp: , Rfl: 4 .  PARoxetine (PAXIL) 20 MG tablet, Take 1 tablet (20 mg total) by mouth at bedtime., Disp: 90 tablet, Rfl: 0  Allergies  Allergen Reactions  . Motrin [Ibuprofen] Other (See Comments)    Klippel Trenauney Syndrome "Doesn't like to take it"     Review of Systems  Constitutional: Positive for irritability.  Cardiovascular: Negative for palpitations.  Psychiatric/Behavioral: Positive for depression. The patient is nervous/anxious. The patient does not have insomnia.     Objective  Filed Vitals:   07/14/15 0958  BP: 118/77  Pulse: 115  Temp: 98.4 F (36.9 C)  TempSrc: Oral  Resp: 19  Height:  (1.575 m)  Weight: 175 lb 6.4 oz (79.561 kg)  SpO2: 98%    Physical Exam  Constitutional: She is oriented to person, place, and time and well-developed, well-nourished, and in no distress.  Cardiovascular: Normal rate and regular rhythm.   Pulmonary/Chest: Effort normal and breath sounds normal.  Neurological: She is alert and oriented to person, place, and time. No cranial nerve deficit.  Psychiatric: Mood, memory, affect and judgment normal.  Nursing note and vitals reviewed.   Assessment & Plan  1. Generalized anxiety disorder  Symptoms stable and proving on clonazepam. Patient aware of the dependence potential. She is taking the medication as directed and as needed. Refills provided and follow-up in 3  months.  - clonazePAM (KLONOPIN) 0.25 MG disintegrating tablet; Take 1 tablet (0.25 mg total) by mouth 2 (two) times daily.  Dispense: 60 tablet; Refill: 2   Ly Wass Asad A. Faylene KurtzShah [facility] 07/14/2015 10:10 AM

## 2015-09-07 ENCOUNTER — Other Ambulatory Visit: Payer: Self-pay | Admitting: Family Medicine

## 2015-09-19 ENCOUNTER — Ambulatory Visit: Payer: Medicaid Other | Admitting: Family Medicine

## 2015-10-11 ENCOUNTER — Ambulatory Visit: Payer: Medicaid Other | Admitting: Family Medicine

## 2015-10-15 ENCOUNTER — Other Ambulatory Visit: Payer: Self-pay | Admitting: Family Medicine

## 2015-10-18 ENCOUNTER — Other Ambulatory Visit
Admission: RE | Admit: 2015-10-18 | Discharge: 2015-10-18 | Disposition: A | Discharge status: Home / Self Care | Payer: Medicaid Other | Source: Ambulatory Visit | Attending: *Deleted | Admitting: *Deleted

## 2015-10-18 ENCOUNTER — Ambulatory Visit (INDEPENDENT_AMBULATORY_CARE_PROVIDER_SITE_OTHER): Payer: Medicaid Other | Admitting: Family Medicine

## 2015-10-18 ENCOUNTER — Ambulatory Visit
Admission: RE | Admit: 2015-10-18 | Discharge: 2015-10-18 | Disposition: A | Discharge status: Home / Self Care | Payer: Medicaid Other | Source: Ambulatory Visit | Attending: Family Medicine | Admitting: Family Medicine

## 2015-10-18 ENCOUNTER — Encounter: Payer: Self-pay | Admitting: Family Medicine

## 2015-10-18 ENCOUNTER — Other Ambulatory Visit: Payer: Self-pay | Admitting: Family Medicine

## 2015-10-18 ENCOUNTER — Telehealth: Payer: Self-pay

## 2015-10-18 VITALS — HR 114 | Temp 98.7°F | Resp 18 | Wt 171.9 lb

## 2015-10-18 DIAGNOSIS — M79661 Pain in right lower leg: Secondary | ICD-10-CM | POA: Diagnosis present

## 2015-10-18 DIAGNOSIS — M899 Disorder of bone, unspecified: Secondary | ICD-10-CM | POA: Diagnosis not present

## 2015-10-18 LAB — CBC WITH DIFFERENTIAL/PLATELET
Basophils Absolute: 0 10*3/uL (ref 0–0.1)
Basophils Relative: 1 %
Eosinophils Absolute: 0.1 10*3/uL (ref 0–0.7)
Eosinophils Relative: 2 %
HEMATOCRIT: 34.6 % — AB (ref 35.0–47.0)
HEMOGLOBIN: 11.7 g/dL — AB (ref 12.0–16.0)
LYMPHS ABS: 1.1 10*3/uL (ref 1.0–3.6)
LYMPHS PCT: 23 %
MCH: 29.4 pg (ref 26.0–34.0)
MCHC: 33.9 g/dL (ref 32.0–36.0)
MCV: 86.5 fL (ref 80.0–100.0)
Monocytes Absolute: 0.3 10*3/uL (ref 0.2–0.9)
Monocytes Relative: 7 %
NEUTROS ABS: 3.2 10*3/uL (ref 1.4–6.5)
NEUTROS PCT: 67 %
Platelets: 210 10*3/uL (ref 150–440)
RBC: 4 MIL/uL (ref 3.80–5.20)
RDW: 14.3 % (ref 11.5–14.5)
WBC: 4.8 10*3/uL (ref 3.6–11.0)

## 2015-10-18 NOTE — Telephone Encounter (Signed)
Called Medicaid obtained Auth  CPT Code: 16109 US Venous Lower Bilateral  Auth # [id number] Start - 10/18/15 Expires - 11/17/15 1 visit

## 2015-10-18 NOTE — Progress Notes (Signed)
Name: Frances Reed   MRN: [id number]    DOB: 1991-05-17   Date:10/18/2015       Progress Note  Subjective  Chief Complaint  Chief Complaint  Patient presents with  . Leg Pain    patient presents with bilateral leg pain for 2 weeks, but her right leg is discolored, reddish and warm to the touch. she has been at work all day and it is hard for her to walk.    Leg Pain  There was no injury mechanism. The pain is present in the right foot, right leg and right knee. The pain is moderate. The pain has been fluctuating (Started 2 weeks ago, got better last Friday (5 days ago), and got worse 3 days ago) since onset.  pain is present in the right lower leg, bruise mark also appeared in the same area, feels like her right leg is swollen. Denies any chest pain, palpitations, shortness of breath or other symptoms.  She does experience chills intermittently but no fever.  Past Medical History  Diagnosis Date  . Klippel-Trenaunay-Weber syndrome   . Sleep apnea   . Generalized anxiety disorder 06/13/2015    Past Surgical History  Procedure Laterality Date  . Tonsillectomy    . Foot surgery Bilateral   . Throat surgery      History reviewed. No pertinent family history.  Social History   Social History  . Marital Status: Single    Spouse Name: N/A  . Number of Children: N/A  . Years of Education: N/A   Occupational History  . Not on file.   Social History Main Topics  . Smoking status: Never Smoker   . Smokeless tobacco: Not on file  . Alcohol Use: No  . Drug Use: No  . Sexual Activity:    Partners: Female    Copy: None   Other Topics Concern  . Not on file   Social History Narrative     Current outpatient prescriptions:  .  clonazePAM (KLONOPIN) 0.25 MG disintegrating tablet, Take 1 tablet (0.25 mg total) by mouth 2 (two) times daily., Disp: 60 tablet, Rfl: 2 .  enoxaparin (LOVENOX) 30 MG/0.3ML injection, Inject 30 mg into the skin daily., Disp: ,  Rfl:  .  gabapentin (NEURONTIN) 100 MG capsule, Take 1 capsule by mouth 2 (two) times daily., Disp: , Rfl: 4 .  PARoxetine (PAXIL) 20 MG tablet, TAKE 1 TABLET BY MOUTH AT BEDTIME, Disp: 90 tablet, Rfl: 0  Allergies  Allergen Reactions  . Motrin [Ibuprofen] Other (See Comments)    Klippel Trenauney Syndrome "Doesn't like to take it"     Review of Systems  Constitutional: Positive for chills. Negative for fever.  Respiratory: Negative for cough, shortness of breath and wheezing.   Cardiovascular: Negative for chest pain and palpitations.  Musculoskeletal: Positive for myalgias and joint pain.   Objective  Filed Vitals:   10/18/15 1537  Pulse: 114  Temp: 98.7 F (37.1 C)  TempSrc: Oral  Resp: 18  Weight: 171 lb 14.4 oz (77.973 kg)  SpO2: 99%    Physical Exam  Constitutional: She is oriented to person, place, and time and well-developed, well-nourished, and in no distress.  Cardiovascular: Normal rate and regular rhythm.   Pulmonary/Chest: Effort normal and breath sounds normal.  Musculoskeletal:  Tenderness to palpation over the right lower extremity, most severe at the right lower lateral region and over her shin, bluish colored bruise on the right lateral lower region, leg appears warm to touch.  Right leg swelling. Gait is antalgic  Neurological: She is alert and oriented to person, place, and time.  Nursing note and vitals reviewed.    Assessment & Plan  1. Pain of right lower leg We will obtain x-ray to rule out musculoskeletal etiology, Doppler ultrasound to rule out DVT, and obtain CBC for evaluation of white blood cell count. Follow-up after review of studies.  - DG Tibia/Fibula Right; Future - DG Foot Complete Right; Future - DG Ankle Complete Right; Future - CBC with Differential - US Venous Img Lower Bilateral; Future   Doniqua Saxby Asad A. Faylene Kurtz [facility] 10/18/2015 3:49 PM

## 2015-10-19 ENCOUNTER — Telehealth: Payer: Self-pay | Admitting: Family Medicine

## 2015-10-19 NOTE — Telephone Encounter (Signed)
Returned call and left a voice message. Please call patient in the morning and will like to speak with her.

## 2015-10-19 NOTE — Telephone Encounter (Signed)
Patient spoke with dr Sherryll Burger last night and he was going to call her back. Stated that he was going to place her on an antibiotic but she has not heard anything from him today. Please return call [phone]

## 2015-10-19 NOTE — Telephone Encounter (Signed)
Routed to Dr. Sherryll Burger for patient call back and medication prescription

## 2015-10-20 ENCOUNTER — Other Ambulatory Visit: Payer: Self-pay | Admitting: Family Medicine

## 2015-10-20 DIAGNOSIS — M79661 Pain in right lower leg: Secondary | ICD-10-CM

## 2015-10-20 MED ORDER — ACETAMINOPHEN-CODEINE #3 300-30 MG PO TABS
1.0000 | ORAL_TABLET | Freq: Three times a day (TID) | ORAL | Status: DC | PRN
Start: 2015-10-20 — End: 2017-04-04

## 2015-10-20 MED ORDER — CEPHALEXIN 500 MG PO TABS: 500.0000 mg | ORAL_TABLET | Freq: Four times a day (QID) | ORAL | Status: DC

## 2015-10-30 ENCOUNTER — Ambulatory Visit: Payer: Medicaid Other | Admitting: Family Medicine

## 2015-12-11 ENCOUNTER — Telehealth: Payer: Self-pay | Admitting: Family Medicine

## 2015-12-11 NOTE — Telephone Encounter (Signed)
PT SAID THAT SHE IS STARTING A NEW JOB BUT NEEDS A NOTE SAYING THAT SHE HAS NO RESTRICTIONS FOR WORK. NEEDS THIS AS SOON AS POSSIBLE SO SHE CAN START WORKING.

## 2015-12-12 NOTE — Telephone Encounter (Signed)
Please schedule an appointment, request patient to bring employment paperwork to determine what type of work she will be performing.

## 2015-12-13 NOTE — Telephone Encounter (Signed)
LFT MESSAGE FOR PT TO CALL AND MAKE APPT AND BRING PAPER SHOWING WHAT IT IS SHE WILL BE DOING ON THE JOB.

## 2016-03-05 ENCOUNTER — Ambulatory Visit: Payer: Medicaid Other | Admitting: Family Medicine

## 2016-03-05 ENCOUNTER — Telehealth: Payer: Self-pay | Admitting: Family Medicine

## 2016-03-05 DIAGNOSIS — F411 Generalized anxiety disorder: Secondary | ICD-10-CM

## 2016-03-05 DIAGNOSIS — M79661 Pain in right lower leg: Secondary | ICD-10-CM

## 2016-03-05 NOTE — Telephone Encounter (Signed)
Please find out which medication refills are needed.

## 2016-03-05 NOTE — Telephone Encounter (Signed)
Pt was scheduled today for an appt and we ran her medicaid and it was not active. Pt called back stating she will not get her medicaid back for 30 days and she is requesting refills on her meds until her insurance gets straight ed out. [facility] [address] [place]. Please advise pt

## 2016-03-06 MED ORDER — PAROXETINE HCL 20 MG PO TABS: 20.0000 mg | ORAL_TABLET | Freq: Every day | ORAL | Status: DC

## 2016-03-06 MED ORDER — GABAPENTIN 100 MG PO CAPS: 100.0000 mg | ORAL_CAPSULE | Freq: Two times a day (BID) | ORAL | Status: DC

## 2016-03-06 MED ORDER — CLONAZEPAM 0.25 MG PO TBDP: 0.2500 mg | ORAL_TABLET | Freq: Two times a day (BID) | ORAL | Status: DC

## 2016-03-06 NOTE — Telephone Encounter (Signed)
Gabapentin and Paxil are sent to patient's pharmacy, clonazepam is ready for pickup

## 2016-03-06 NOTE — Telephone Encounter (Signed)
LM for pt to call the office.

## 2016-03-06 NOTE — Telephone Encounter (Signed)
Pt is needing Gabepentine, Paxil and Klonopin. [facility] [address]

## 2016-03-06 NOTE — Addendum Note (Signed)
Addended bySherryll Burger: Malala Trenkamp A A on: 03/06/2016 06:35 PM   Modules accepted: Orders

## 2016-04-20 ENCOUNTER — Other Ambulatory Visit: Payer: Self-pay | Admitting: Family Medicine

## 2016-05-10 ENCOUNTER — Telehealth: Payer: Self-pay | Admitting: Family Medicine

## 2016-05-10 NOTE — Telephone Encounter (Signed)
Please schedule patient for an office visit appointment for medication refills.

## 2016-05-10 NOTE — Telephone Encounter (Signed)
Pt scheduled an appt °

## 2016-05-27 ENCOUNTER — Ambulatory Visit: Payer: Medicaid Other | Admitting: Family Medicine

## 2016-12-05 ENCOUNTER — Ambulatory Visit (INDEPENDENT_AMBULATORY_CARE_PROVIDER_SITE_OTHER): Payer: Self-pay | Admitting: Family Medicine

## 2016-12-05 ENCOUNTER — Encounter: Payer: Self-pay | Admitting: Family Medicine

## 2016-12-05 VITALS — BP 120/68 | HR 101 | Temp 98.1°F | Resp 17 | Ht 62.0 in | Wt 171.3 lb

## 2016-12-05 DIAGNOSIS — Q872 Congenital malformation syndromes predominantly involving limbs: Secondary | ICD-10-CM

## 2016-12-05 DIAGNOSIS — F411 Generalized anxiety disorder: Secondary | ICD-10-CM

## 2016-12-05 MED ORDER — PAROXETINE HCL 20 MG PO TABS: 20.0000 mg | ORAL_TABLET | Freq: Every day | ORAL | 0 refills | Status: DC

## 2016-12-05 MED ORDER — CLONAZEPAM 0.5 MG PO TABS: 0.2500 mg | ORAL_TABLET | Freq: Two times a day (BID) | ORAL | 2 refills | Status: DC | PRN

## 2016-12-05 MED ORDER — GABAPENTIN 100 MG PO CAPS: 100.0000 mg | ORAL_CAPSULE | Freq: Two times a day (BID) | ORAL | 0 refills | Status: DC

## 2016-12-05 NOTE — Progress Notes (Signed)
Name: Frances Reed   MRN: [id number]    DOB: February 17, 1991   Date:12/05/2016       Progress Note  Subjective  Chief Complaint  Chief Complaint  Patient presents with  . Medication Refill    Anxiety  Presents for initial visit. The problem has been gradually worsening. Symptoms include chest pain, excessive worry, feeling of choking, nervous/anxious behavior, panic and shortness of breath. The severity of symptoms is moderate and causing significant distress.   Her past medical history is significant for anxiety/panic attacks. Past treatments include benzodiazephines and SSRIs (has not taken Clonazepam and Paxil since December 2017).   Patient is also requesting prescription for Gabapentin, she has KTW syndrome, has pain and tingling in both legs, she takes Gabapentin which seems to help. Wearing a compression hose also makes it better.  Past Medical History:  Diagnosis Date  . Generalized anxiety disorder 06/13/2015  . Klippel-Trenaunay-Weber syndrome   . Sleep apnea     Past Surgical History:  Procedure Laterality Date  . FOOT SURGERY Bilateral   . THROAT SURGERY    . TONSILLECTOMY      History reviewed. No pertinent family history.  Social History   Social History  . Marital status: Single    Spouse name: N/A  . Number of children: N/A  . Years of education: N/A   Occupational History  . Not on file.   Social History Main Topics  . Smoking status: Never Smoker  . Smokeless tobacco: Never Used  . Alcohol use No  . Drug use: No  . Sexual activity: Yes    Partners: Female    Birth control/ protection: None   Other Topics Concern  . Not on file   Social History Narrative  . No narrative on file     Current Outpatient Prescriptions:  .  acetaminophen-codeine (TYLENOL #3) 300-30 MG tablet, Take 1 tablet by mouth every 8 (eight) hours as needed for moderate pain., Disp: 30 tablet, Rfl: 0 .  Cephalexin 500 MG tablet, Take 1 tablet (500 mg total) by mouth every  6 (six) hours., Disp: 40 tablet, Rfl: 0 .  clonazePAM (KLONOPIN) 0.25 MG disintegrating tablet, Take 1 tablet (0.25 mg total) by mouth 2 (two) times daily., Disp: 60 tablet, Rfl: 2 .  enoxaparin (LOVENOX) 30 MG/0.3ML injection, Inject 30 mg into the skin daily., Disp: , Rfl:  .  gabapentin (NEURONTIN) 100 MG capsule, Take 1 capsule (100 mg total) by mouth 2 (two) times daily., Disp: 60 capsule, Rfl: 0 .  PARoxetine (PAXIL) 20 MG tablet, Take 1 tablet (20 mg total) by mouth at bedtime., Disp: 30 tablet, Rfl: 0  Allergies  Allergen Reactions  . Motrin [Ibuprofen] Other (See Comments)    Klippel Trenauney Syndrome "Doesn't like to take it"     Review of Systems  Respiratory: Positive for shortness of breath.   Cardiovascular: Positive for chest pain.  Psychiatric/Behavioral: The patient is nervous/anxious.      Objective  Vitals:   12/05/16 1105  BP: 120/68  Pulse: (!) 101  Resp: 17  Temp: 98.1 F (36.7 C)  TempSrc: Oral  SpO2: 99%  Weight: 171 lb 4.8 oz (77.7 kg)  Height:  (1.575 m)    Physical Exam  Constitutional: She is oriented to person, place, and time and well-developed, well-nourished, and in no distress.  HENT:  Head: Normocephalic and atraumatic.  Cardiovascular: Normal rate, regular rhythm and normal heart sounds.  Exam reveals no gallop.   No murmur heard.  Pulmonary/Chest: Effort normal and breath sounds normal. She has no wheezes.  Abdominal: Soft. Bowel sounds are normal. There is no tenderness.  Musculoskeletal: She exhibits no edema.  Neurological: She is alert and oriented to person, place, and time.  Psychiatric: Mood, memory, affect and judgment normal.  Nursing note and vitals reviewed.       Assessment & Plan  1. Generalized anxiety disorder Symptoms are acutely worse because patient has not been able to afford doctors visits, resume clonazepam and paroxetine at previous dosages, refills provided - PARoxetine (PAXIL) 20 MG tablet; Take  1 tablet (20 mg total) by mouth at bedtime.  Dispense: 90 tablet; Refill: 0 - clonazePAM (KLONOPIN) 0.5 MG tablet; Take 0.5 tablets (0.25 mg total) by mouth 2 (two) times daily as needed for anxiety.  Dispense: 30 tablet; Refill: 2  2. Klippel-Trenaunay-Weber syndrome  - gabapentin (NEURONTIN) 100 MG capsule; Take 1 capsule (100 mg total) by mouth 2 (two) times daily.  Dispense: 60 capsule; Refill: 0   Timmy Cleverly Asad A[facility] 12/05/2016 11:29 AM

## 2017-01-02 ENCOUNTER — Ambulatory Visit: Payer: Self-pay | Admitting: Family Medicine

## 2017-01-08 ENCOUNTER — Other Ambulatory Visit: Payer: Self-pay | Admitting: Family Medicine

## 2017-01-08 DIAGNOSIS — Q872 Congenital malformation syndromes predominantly involving limbs: Secondary | ICD-10-CM

## 2017-03-21 ENCOUNTER — Other Ambulatory Visit: Payer: Self-pay | Admitting: Family Medicine

## 2017-03-21 DIAGNOSIS — Q872 Congenital malformation syndromes predominantly involving limbs: Secondary | ICD-10-CM

## 2017-03-30 ENCOUNTER — Telehealth: Payer: Self-pay | Admitting: Family Medicine

## 2017-03-30 DIAGNOSIS — F411 Generalized anxiety disorder: Secondary | ICD-10-CM

## 2017-03-31 NOTE — Telephone Encounter (Signed)
I reviewed Dr. Margaretmary EddyShah's last note; patient was supposed to have been seen one month after last visit I'll approve one week of medicine to cover in his absence, but please get her on the schedule to see Dr. Sherryll BurgerShah

## 2017-04-01 NOTE — Telephone Encounter (Signed)
[facility]

## 2017-04-01 NOTE — Telephone Encounter (Signed)
It was a typo sorry, she is scheduled with dr Sherryll Burgershah

## 2017-04-01 NOTE — Telephone Encounter (Signed)
Spoke with patient and she did schedule appt for 04/10/17 with Dr Sherie DonLada

## 2017-04-03 DIAGNOSIS — Z79899 Other long term (current) drug therapy: Secondary | ICD-10-CM | POA: Insufficient documentation

## 2017-04-03 DIAGNOSIS — M545 Low back pain: Secondary | ICD-10-CM | POA: Insufficient documentation

## 2017-04-03 DIAGNOSIS — R1032 Left lower quadrant pain: Secondary | ICD-10-CM | POA: Insufficient documentation

## 2017-04-03 DIAGNOSIS — R112 Nausea with vomiting, unspecified: Secondary | ICD-10-CM | POA: Insufficient documentation

## 2017-04-03 DIAGNOSIS — R509 Fever, unspecified: Secondary | ICD-10-CM | POA: Insufficient documentation

## 2017-04-03 DIAGNOSIS — R300 Dysuria: Secondary | ICD-10-CM | POA: Insufficient documentation

## 2017-04-03 DIAGNOSIS — R1011 Right upper quadrant pain: Secondary | ICD-10-CM | POA: Insufficient documentation

## 2017-04-03 LAB — CBC
HEMATOCRIT: 32.5 % — AB (ref 36.0–46.0)
HEMOGLOBIN: 10.9 g/dL — AB (ref 12.0–15.0)
MCH: 29.9 pg (ref 26.0–34.0)
MCHC: 33.5 g/dL (ref 30.0–36.0)
MCV: 89.3 fL (ref 78.0–100.0)
Platelets: 154 10*3/uL (ref 150–400)
RBC: 3.64 MIL/uL — AB (ref 3.87–5.11)
RDW: 14.1 % (ref 11.5–15.5)
WBC: 2.7 10*3/uL — ABNORMAL LOW (ref 4.0–10.5)

## 2017-04-03 LAB — URINALYSIS, ROUTINE W REFLEX MICROSCOPIC
Bilirubin Urine: NEGATIVE
GLUCOSE, UA: NEGATIVE mg/dL
KETONES UR: NEGATIVE mg/dL
Leukocytes, UA: NEGATIVE
Nitrite: NEGATIVE
PROTEIN: NEGATIVE mg/dL
Specific Gravity, Urine: 1.027 (ref 1.005–1.030)
pH: 5 (ref 5.0–8.0)

## 2017-04-03 LAB — COMPREHENSIVE METABOLIC PANEL
ALT: 9 U/L — ABNORMAL LOW (ref 14–54)
ANION GAP: 8 (ref 5–15)
AST: 15 U/L (ref 15–41)
Albumin: 4.1 g/dL (ref 3.5–5.0)
Alkaline Phosphatase: 45 U/L (ref 38–126)
BUN: 11 mg/dL (ref 6–20)
CALCIUM: 8.6 mg/dL — AB (ref 8.9–10.3)
CHLORIDE: 105 mmol/L (ref 101–111)
CO2: 23 mmol/L (ref 22–32)
Creatinine, Ser: 0.91 mg/dL (ref 0.44–1.00)
GFR calc non Af Amer: >60 mL/min (ref >60)
Glucose, Bld: 88 mg/dL (ref 65–99)
POTASSIUM: 3.9 mmol/L (ref 3.5–5.1)
SODIUM: 136 mmol/L (ref 135–145)
Total Bilirubin: 1.2 mg/dL (ref 0.3–1.2)
Total Protein: 6.5 g/dL (ref 6.5–8.1)

## 2017-04-03 LAB — LIPASE, BLOOD: LIPASE: 20 U/L (ref 11–51)

## 2017-04-03 LAB — I-STAT BETA HCG BLOOD, ED (MC, WL, AP ONLY)

## 2017-04-03 MED ORDER — OXYCODONE-ACETAMINOPHEN 5-325 MG PO TABS
1.0000 | ORAL_TABLET | ORAL | Status: AC | PRN
  Administered 2017-04-03 – 2017-04-04 (×2): 1 via ORAL
  Filled 2017-04-03 (×2): qty 1

## 2017-04-03 MED ORDER — ONDANSETRON 4 MG PO TBDP
4.0000 mg | ORAL_TABLET | Freq: Once | ORAL | Status: AC | PRN
  Administered 2017-04-03: 4 mg via ORAL
  Filled 2017-04-03: qty 1

## 2017-04-03 NOTE — ED Notes (Signed)
Pain medication given in Triage. Patient advised about side effects of medications and  to avoid driving for a minimum of 4 hours.  

## 2017-04-03 NOTE — ED Triage Notes (Signed)
Pt states that she has had abdominal pain and N/V x 2-3 days along with dysuria. States it feels the same as when she had a kidney stone. Alert and oriented.

## 2017-04-04 ENCOUNTER — Encounter (HOSPITAL_COMMUNITY): Payer: Self-pay

## 2017-04-04 ENCOUNTER — Emergency Department (HOSPITAL_COMMUNITY)
Admission: EM | Admit: 2017-04-04 | Discharge: 2017-04-04 | Disposition: A | Discharge status: Home / Self Care | Payer: Self-pay | Attending: Emergency Medicine | Admitting: Emergency Medicine

## 2017-04-04 ENCOUNTER — Emergency Department (HOSPITAL_COMMUNITY): Payer: Self-pay

## 2017-04-04 DIAGNOSIS — R112 Nausea with vomiting, unspecified: Secondary | ICD-10-CM

## 2017-04-04 DIAGNOSIS — N83202 Unspecified ovarian cyst, left side: Secondary | ICD-10-CM

## 2017-04-04 DIAGNOSIS — R1030 Lower abdominal pain, unspecified: Secondary | ICD-10-CM

## 2017-04-04 LAB — WET PREP, GENITAL
Sperm: NONE SEEN
TRICH WET PREP: NONE SEEN
YEAST WET PREP: NONE SEEN

## 2017-04-04 MED ORDER — ONDANSETRON 4 MG PO TBDP: 4.0000 mg | ORAL_TABLET | Freq: Three times a day (TID) | ORAL | 0 refills | Status: DC | PRN

## 2017-04-04 MED ORDER — IOPAMIDOL (ISOVUE-300) INJECTION 61%
100.0000 mL | Freq: Once | INTRAVENOUS | Status: AC | PRN
  Administered 2017-04-04: 100 mL via INTRAVENOUS

## 2017-04-04 MED ORDER — MORPHINE SULFATE (PF) 4 MG/ML IV SOLN
4.0000 mg | Freq: Once | INTRAVENOUS | Status: AC
  Administered 2017-04-04: 4 mg via INTRAVENOUS
  Filled 2017-04-04: qty 1

## 2017-04-04 MED ORDER — OXYCODONE-ACETAMINOPHEN 5-325 MG PO TABS: 1.0000 | ORAL_TABLET | Freq: Four times a day (QID) | ORAL | 0 refills | Status: DC | PRN

## 2017-04-04 MED ORDER — IOPAMIDOL (ISOVUE-300) INJECTION 61%
INTRAVENOUS | Status: AC
  Administered 2017-04-04: 100 mL via INTRAVENOUS
  Filled 2017-04-04: qty 100

## 2017-04-04 NOTE — ED Notes (Signed)
US at bedside

## 2017-04-04 NOTE — Discharge Instructions (Signed)
You have a very large left ovarian cyst, likely contributing to your pain. No active kidney stones today. Follow-up with GYN when possible. Return with worsening uncontrolled pain, fevers, vomiting or, new symptoms or other concerns.
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
## 2017-04-04 NOTE — ED Provider Notes (Signed)
7:14 AM Handoff from Muthersbaugh PA-C at shift change.   Pt with lower abd pain, fever. Found to have a degree or urinary retention. Also large R ovarian cyst on CT.   Currently pending pelvic US to r/o torsion.   BP 137/76 (BP Location: Right Arm)   Pulse 76   Temp 98.7 F (37.1 C) (Oral)   Resp 18   SpO2 98%    10:41 AM Patient updated on results. CT suggests cysts is from the right side, ultrasound suggested left. No torsion noted.  Will discharge patient to home with pain medication, nausea medicine, GYN follow-up, work note.  The patient was urged to return to the Emergency Department immediately with worsening of current symptoms, worsening abdominal pain, persistent vomiting, blood noted in stools, fever, or any other concerns. The patient verbalized understanding.      Renne CriglerGeiple, Erion Weightman, PA-C 04/04/17 1041    Pricilla LovelessGoldston, Scott, MD 04/12/17 [phone]

## 2017-04-09 ENCOUNTER — Other Ambulatory Visit: Payer: Self-pay | Admitting: Family Medicine

## 2017-04-09 DIAGNOSIS — F411 Generalized anxiety disorder: Secondary | ICD-10-CM

## 2017-04-09 DIAGNOSIS — Q872 Congenital malformation syndromes predominantly involving limbs: Secondary | ICD-10-CM

## 2017-04-10 ENCOUNTER — Ambulatory Visit (INDEPENDENT_AMBULATORY_CARE_PROVIDER_SITE_OTHER): Payer: Self-pay | Admitting: Family Medicine

## 2017-04-10 ENCOUNTER — Encounter: Payer: Self-pay | Admitting: Family Medicine

## 2017-04-10 VITALS — BP 115/72 | HR 78 | Temp 98.0°F | Resp 16 | Ht 62.0 in | Wt 173.2 lb

## 2017-04-10 DIAGNOSIS — Q872 Congenital malformation syndromes predominantly involving limbs: Secondary | ICD-10-CM

## 2017-04-10 DIAGNOSIS — N760 Acute vaginitis: Secondary | ICD-10-CM

## 2017-04-10 DIAGNOSIS — F411 Generalized anxiety disorder: Secondary | ICD-10-CM

## 2017-04-10 DIAGNOSIS — B9689 Other specified bacterial agents as the cause of diseases classified elsewhere: Secondary | ICD-10-CM

## 2017-04-10 DIAGNOSIS — N200 Calculus of kidney: Secondary | ICD-10-CM

## 2017-04-10 DIAGNOSIS — N83202 Unspecified ovarian cyst, left side: Secondary | ICD-10-CM

## 2017-04-10 MED ORDER — CLONAZEPAM 0.5 MG PO TABS: 0.2500 mg | ORAL_TABLET | Freq: Two times a day (BID) | ORAL | 2 refills | Status: DC | PRN

## 2017-04-10 MED ORDER — GABAPENTIN 100 MG PO CAPS: 100.0000 mg | ORAL_CAPSULE | Freq: Two times a day (BID) | ORAL | 0 refills | Status: DC

## 2017-04-10 MED ORDER — OXYCODONE-ACETAMINOPHEN 7.5-325 MG PO TABS: 1.0000 | ORAL_TABLET | Freq: Three times a day (TID) | ORAL | 0 refills | Status: AC | PRN

## 2017-04-10 MED ORDER — ONDANSETRON 4 MG PO TBDP: 4.0000 mg | ORAL_TABLET | Freq: Three times a day (TID) | ORAL | 0 refills | Status: DC | PRN

## 2017-04-10 MED ORDER — METRONIDAZOLE 500 MG PO TABS: 500.0000 mg | ORAL_TABLET | Freq: Two times a day (BID) | ORAL | 0 refills | Status: AC

## 2017-04-10 MED ORDER — PAROXETINE HCL 20 MG PO TABS: 20.0000 mg | ORAL_TABLET | Freq: Every day | ORAL | 0 refills | Status: DC

## 2017-04-10 NOTE — Progress Notes (Signed)
Name: Frances Reed   MRN: [id number]    DOB: September 08, 1990   Date:04/10/2017       Progress Note  Subjective  Chief Complaint  Chief Complaint  Patient presents with  . Medication Refill    Anxiety  Presents for follow-up visit. Symptoms include chest pain, dizziness, excessive worry, insomnia, nervous/anxious behavior and shortness of breath. Patient reports no depressed mood, feeling of choking, irritability, palpitations or suicidal ideas. The severity of symptoms is causing significant distress and moderate. The quality of sleep is poor.    Patient had KTW syndrome which sometimes causes her to have pain on the left side of her body, mostly in the arm and leg, sometimes tingling as well. She takes Gabapentin to relieve these symptoms, which seems to be effective.   Patient is also experiencing left lower abdomen and bilateral flank pain, she was seen in the ER 7 days ago with abdominal pain, nausea and vomiting. AN ultrasound of abdomne and pelvis showed 13 cm left ovarian cyst. She was prescribed Percocet for pain, antiemetic for nausea. SHe reports pain is marginally improved on Percodet 5mg , nausea is persistent, vomiting is much better. She was discharged to follow up with gynecologist but cannot get an appointment until the end of September 2018, so she is requesting an early appointment with a different provider if possible.     Past Medical History:  Diagnosis Date  . Generalized anxiety disorder 06/13/2015  . Klippel-Trenaunay-Weber syndrome   . Sleep apnea     Past Surgical History:  Procedure Laterality Date  . FOOT SURGERY Bilateral   . THROAT SURGERY    . TONSILLECTOMY      History reviewed. No pertinent family history.  Social History   Social History  . Marital status: Single    Spouse name: N/A  . Number of children: N/A  . Years of education: N/A   Occupational History  . Not on file.   Social History Main Topics  . Smoking status: Never Smoker  .  Smokeless tobacco: Never Used  . Alcohol use No  . Drug use: No  . Sexual activity: Yes    Partners: Female    Birth control/ protection: None   Other Topics Concern  . Not on file   Social History Narrative  . No narrative on file     Current Outpatient Prescriptions:  .  acetaminophen (TYLENOL) 500 MG tablet, Take 1,000 mg by mouth every 6 (six) hours as needed for mild pain., Disp: , Rfl:  .  clonazePAM (KLONOPIN) 0.5 MG tablet, Take 0.5 tablets (0.25 mg total) by mouth 2 (two) times daily as needed for anxiety. (Patient taking differently: Take 0.5 mg by mouth at bedtime. ), Disp: 30 tablet, Rfl: 2 .  gabapentin (NEURONTIN) 100 MG capsule, TAKE 1 CAPSULE (100 MG TOTAL) BY MOUTH 2 (TWO) TIMES DAILY., Disp: 180 capsule, Rfl: 0 .  ondansetron (ZOFRAN ODT) 4 MG disintegrating tablet, Take 1 tablet (4 mg total) by mouth every 8 (eight) hours as needed for nausea or vomiting., Disp: 10 tablet, Rfl: 0 .  oxyCODONE-acetaminophen (PERCOCET/ROXICET) 5-325 MG tablet, Take 1 tablet by mouth every 6 (six) hours as needed for severe pain., Disp: 8 tablet, Rfl: 0 .  PARoxetine (PAXIL) 20 MG tablet, TAKE 1 TABLET (20 MG TOTAL) BY MOUTH AT BEDTIME., Disp: 7 tablet, Rfl: 0  Allergies  Allergen Reactions  . Motrin [Ibuprofen] Other (See Comments)    Klippel Trenauney Syndrome "Doesn't like to take it"  Review of Systems  Constitutional: Negative for irritability.  Respiratory: Positive for shortness of breath.   Cardiovascular: Positive for chest pain. Negative for palpitations.  Neurological: Positive for dizziness.  Psychiatric/Behavioral: Negative for suicidal ideas. The patient is nervous/anxious and has insomnia.       Objective  Vitals:   04/10/17 1029  BP: 115/72  Pulse: 78  Resp: 16  Temp: 98 F (36.7 C)  TempSrc: Oral  SpO2: 99%  Weight: 173 lb 3.2 oz (78.6 kg)  Height: 5\' 2"  (1.575 m)    Physical Exam  Constitutional: She is well-developed, well-nourished, and  in no distress.  Cardiovascular: Normal rate, regular rhythm and normal heart sounds.   No murmur heard. Pulmonary/Chest: Effort normal and breath sounds normal. She has no wheezes.  Abdominal: Soft. Bowel sounds are normal. There is tenderness in the left lower quadrant. There is CVA tenderness. There is no rigidity and no guarding.  Nursing note and vitals reviewed.   Assessment & Plan  1. Generalized anxiety disorder Stable and responsive to Paxil taken every day, clonazepam twice daily as needed, patient compliant with controlled substances agreement. Refills provided - clonazePAM (KLONOPIN) 0.5 MG tablet; Take 0.5 tablets (0.25 mg total) by mouth 2 (two) times daily as needed for anxiety.  Dispense: 60 tablet; Refill: 2 - PARoxetine (PAXIL) 20 MG tablet; Take 1 tablet (20 mg total) by mouth daily.  Dispense: 90 tablet; Refill: 0  2. Klippel-Trenaunay-Weber syndrome With associated numbness and tingling in left side of the body, responsive to gabapentin - gabapentin (NEURONTIN) 100 MG capsule; Take 1 capsule (100 mg total) by mouth 2 (two) times daily.  Dispense: 180 capsule; Refill: 0  3. Left ovarian cyst reviewed ultrasound findings from the ER visit, pain is unresponsive to low-dose Percocet so we'll increase to 7.5 mg every 8 hours as needed, Zofran for nausea. Referral to GYN - ondansetron (ZOFRAN ODT) 4 MG disintegrating tablet; Take 1 tablet (4 mg total) by mouth every 8 (eight) hours as needed for nausea or vomiting.  Dispense: 10 tablet; Refill: 0 - oxyCODONE-acetaminophen (PERCOCET) 7.5-325 MG tablet; Take 1 tablet by mouth every 8 (eight) hours as needed for severe pain.  Dispense: 15 tablet; Refill: 0 - Ambulatory referral to Gynecology  4. Right kidney stone Referral to urology for management of right kidney stones seen on ultrasound - oxyCODONE-acetaminophen (PERCOCET) 7.5-325 MG tablet; Take 1 tablet by mouth every 8 (eight) hours as needed for severe pain.  Dispense:  15 tablet; Refill: 0 - Ambulatory referral to Urology  5. BV (bacterial vaginosis)  - metroNIDAZOLE (FLAGYL) 500 MG tablet; Take 1 tablet (500 mg total) by mouth 2 (two) times daily.  Dispense: 14 tablet; Refill: 0   Kindra Bickham Asad A. Faylene KurtzShah [facility] 04/10/2017 10:41 AM

## 2017-04-23 ENCOUNTER — Ambulatory Visit: Payer: Medicaid Other | Admitting: Urology

## 2017-06-17 ENCOUNTER — Encounter (HOSPITAL_COMMUNITY): Payer: Self-pay | Admitting: *Deleted

## 2017-06-17 ENCOUNTER — Inpatient Hospital Stay (HOSPITAL_COMMUNITY)
Admission: AD | Admit: 2017-06-17 | Discharge: 2017-06-17 | Disposition: A | Discharge status: Home / Self Care | Payer: Self-pay | Source: Ambulatory Visit | Attending: Family Medicine | Admitting: Family Medicine

## 2017-06-17 DIAGNOSIS — K529 Noninfective gastroenteritis and colitis, unspecified: Secondary | ICD-10-CM

## 2017-06-17 DIAGNOSIS — Z3202 Encounter for pregnancy test, result negative: Secondary | ICD-10-CM | POA: Insufficient documentation

## 2017-06-17 DIAGNOSIS — R55 Syncope and collapse: Secondary | ICD-10-CM | POA: Insufficient documentation

## 2017-06-17 LAB — URINALYSIS, ROUTINE W REFLEX MICROSCOPIC
Bilirubin Urine: NEGATIVE
GLUCOSE, UA: NEGATIVE mg/dL
HGB URINE DIPSTICK: NEGATIVE
KETONES UR: NEGATIVE mg/dL
Leukocytes, UA: NEGATIVE
Nitrite: NEGATIVE
PROTEIN: NEGATIVE mg/dL
Specific Gravity, Urine: 1.018 (ref 1.005–1.030)
pH: 6 (ref 5.0–8.0)

## 2017-06-17 LAB — CBC
HEMATOCRIT: 35.6 % — AB (ref 36.0–46.0)
Hemoglobin: 11.9 g/dL — ABNORMAL LOW (ref 12.0–15.0)
MCH: 31.2 pg (ref 26.0–34.0)
MCHC: 33.4 g/dL (ref 30.0–36.0)
MCV: 93.4 fL (ref 78.0–100.0)
PLATELETS: 160 10*3/uL (ref 150–400)
RBC: 3.81 MIL/uL — ABNORMAL LOW (ref 3.87–5.11)
RDW: 14.3 % (ref 11.5–15.5)
WBC: 4.7 10*3/uL (ref 4.0–10.5)

## 2017-06-17 LAB — POCT PREGNANCY, URINE: Preg Test, Ur: NEGATIVE

## 2017-06-17 LAB — COMPREHENSIVE METABOLIC PANEL
ALBUMIN: 3.6 g/dL (ref 3.5–5.0)
ALT: 12 U/L — ABNORMAL LOW (ref 14–54)
ANION GAP: 6 (ref 5–15)
AST: 15 U/L (ref 15–41)
Alkaline Phosphatase: 53 U/L (ref 38–126)
BUN: 10 mg/dL (ref 6–20)
CHLORIDE: 104 mmol/L (ref 101–111)
CO2: 27 mmol/L (ref 22–32)
Calcium: 8.7 mg/dL — ABNORMAL LOW (ref 8.9–10.3)
Creatinine, Ser: 0.81 mg/dL (ref 0.44–1.00)
GFR calc Af Amer: >60 mL/min (ref >60)
GFR calc non Af Amer: >60 mL/min (ref >60)
GLUCOSE: 105 mg/dL — AB (ref 65–99)
POTASSIUM: 3.8 mmol/L (ref 3.5–5.1)
SODIUM: 137 mmol/L (ref 135–145)
TOTAL PROTEIN: 6.7 g/dL (ref 6.5–8.1)
Total Bilirubin: 0.6 mg/dL (ref 0.3–1.2)

## 2017-06-17 MED ORDER — SODIUM CHLORIDE 0.9 % IV SOLN
8.0000 mg | Freq: Once | INTRAVENOUS | Status: AC
  Administered 2017-06-17: 8 mg via INTRAVENOUS
  Filled 2017-06-17: qty 4

## 2017-06-17 MED ORDER — PROMETHAZINE HCL 25 MG PO TABS
25.0000 mg | ORAL_TABLET | Freq: Once | ORAL | Status: AC
  Administered 2017-06-17: 25 mg via ORAL
  Filled 2017-06-17: qty 1

## 2017-06-17 MED ORDER — SODIUM CHLORIDE 0.9 % IV BOLUS (SEPSIS)
1000.0000 mL | Freq: Once | INTRAVENOUS | Status: AC
  Administered 2017-06-17: 1000 mL via INTRAVENOUS
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
## 2017-06-22 ENCOUNTER — Other Ambulatory Visit: Payer: Self-pay | Admitting: Family Medicine

## 2017-06-22 DIAGNOSIS — Q872 Congenital malformation syndromes predominantly involving limbs: Secondary | ICD-10-CM

## 2017-06-23 NOTE — Telephone Encounter (Signed)
Copied from CRM #[id number]. Topic: General - Other >> Jun 23, 2017  3:10 PM Viviann SpareWhite, Selina wrote: Reason for CRM: Pt is requesting a refill on her gabupentin 100mg . Pt stated she is out of the med.

## 2017-07-09 ENCOUNTER — Telehealth: Payer: Self-pay | Admitting: Family Medicine

## 2017-07-09 NOTE — Telephone Encounter (Signed)
Copied from CRM [phone]. Topic: Inquiry >> Jul 09, 2017  9:42 AM Windy KalataMichael, Taylor L, NT wrote: Morrie SheldonAshley with [facility] is calling to check on a RX faxed over for pt. ( Paxil) Morrie Sheldonshley would like a call back @ [phone]

## 2017-07-11 ENCOUNTER — Ambulatory Visit: Payer: Medicaid Other | Admitting: Family Medicine

## 2018-02-16 IMAGING — US US RENAL
1 series · 14 of 25 positions shown · non-contrast
Comparison: Renal ultrasound performed 05/31/2014

CLINICAL DATA: Acute onset of left flank pain, nausea and vomiting.
Hematuria. Initial encounter.

EXAM:
RENAL / URINARY TRACT ULTRASOUND COMPLETE

[Series 1: us renal · 0.28mm/px · 14 of 39 slices shown]
[im 1/39]
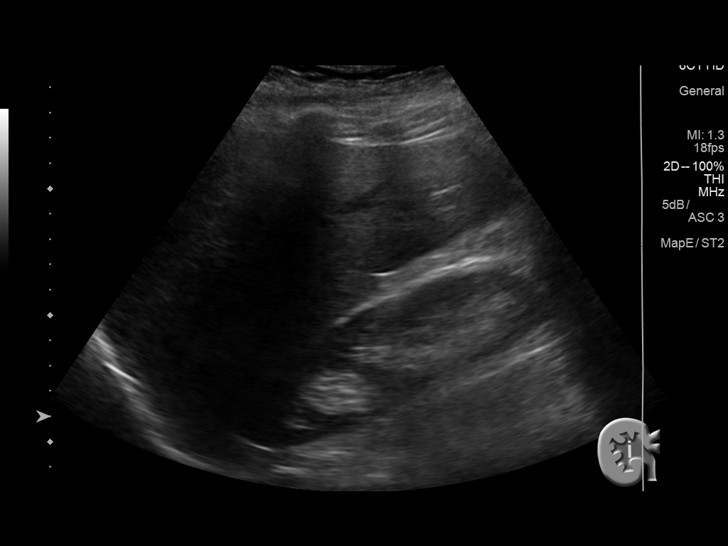
[im 4/39]
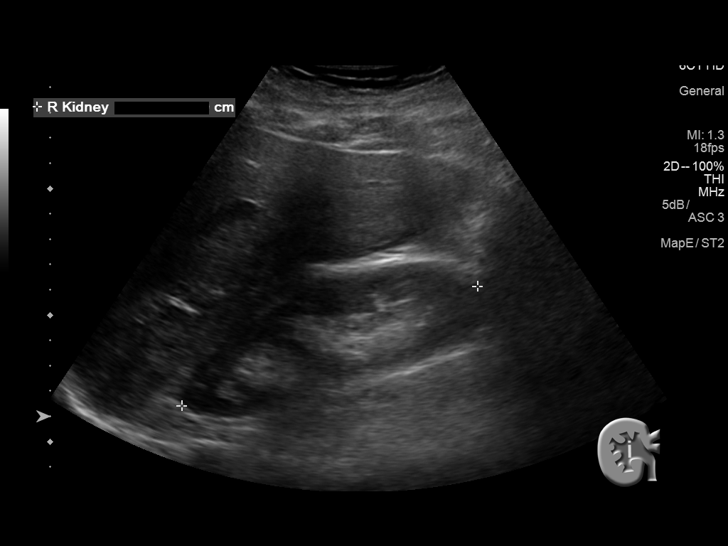
[im 7/39]
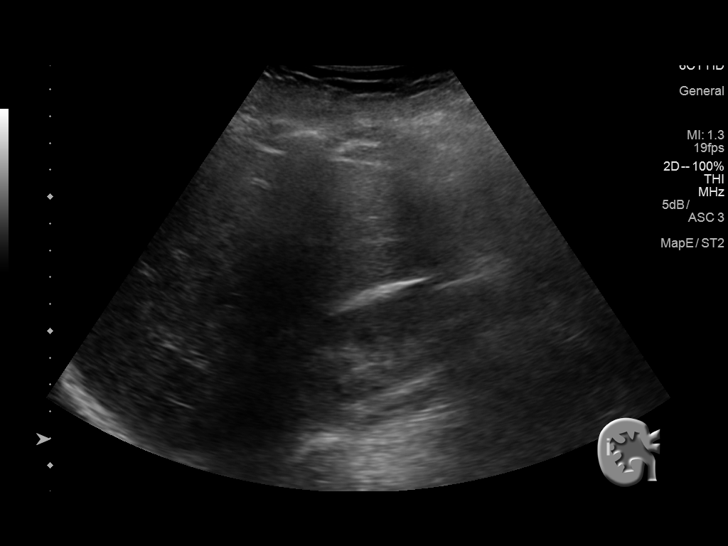
[im 10/39]
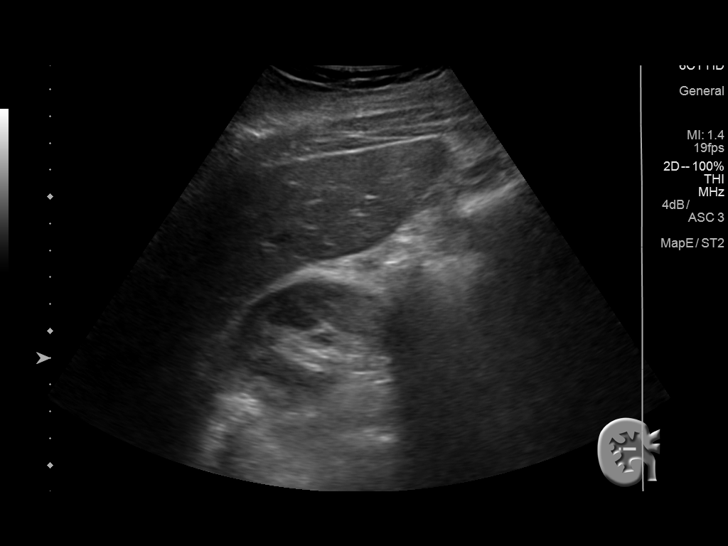
[im 13/39]
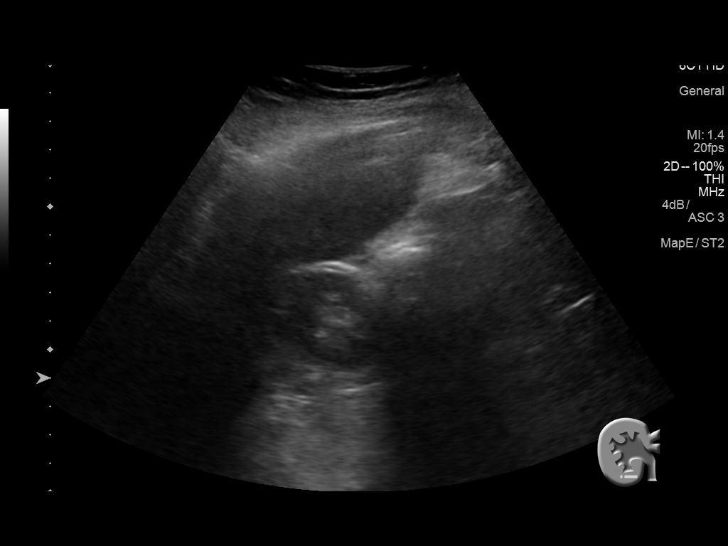
[im 15/39]
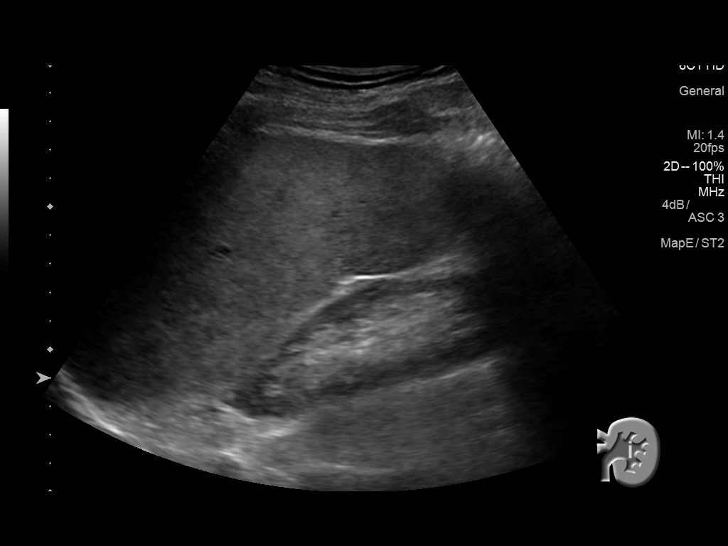
[im 18/39]
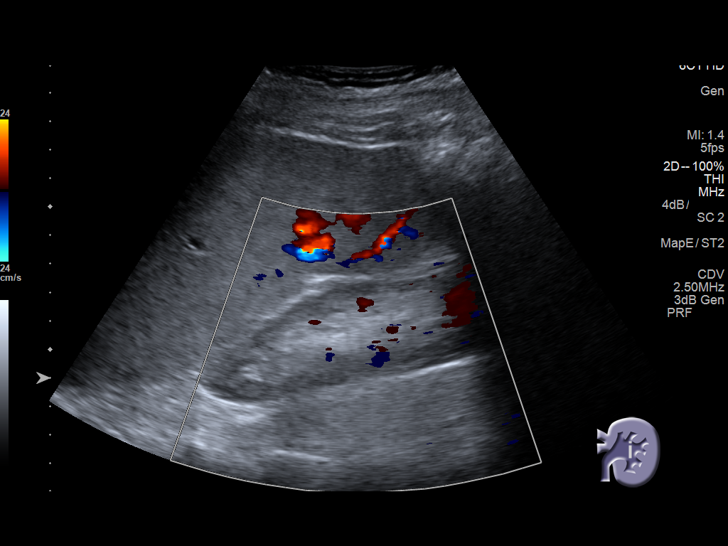
[im 21/39]
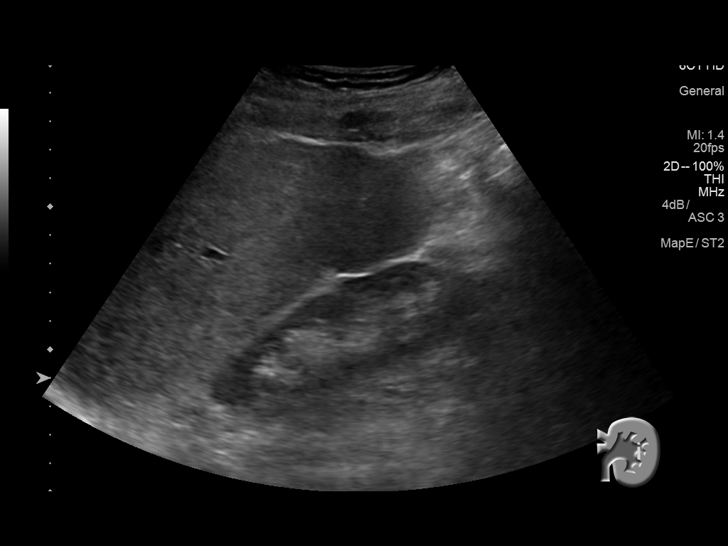
[im 24/39]
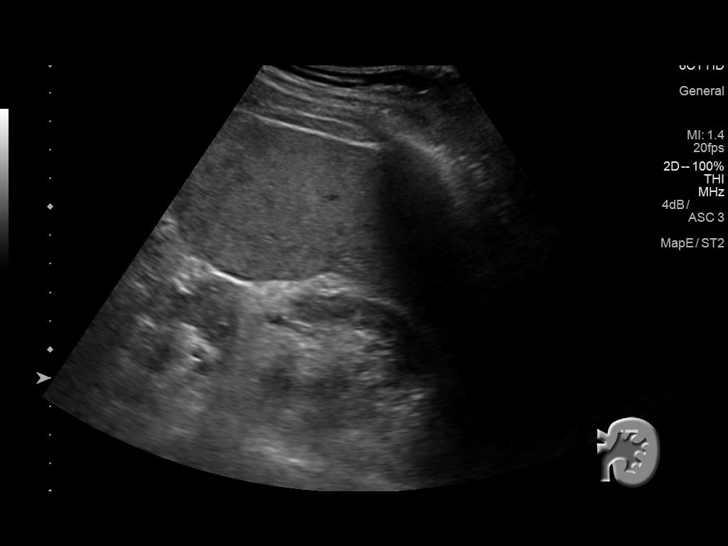
[im 26/39]
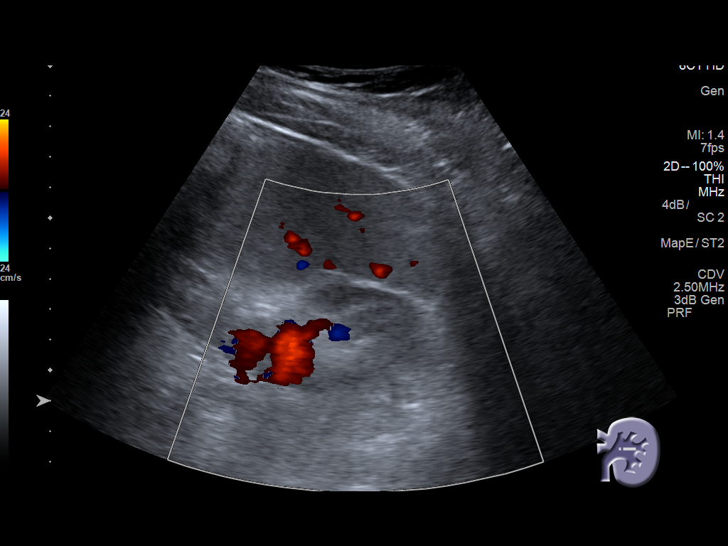
[im 29/39]
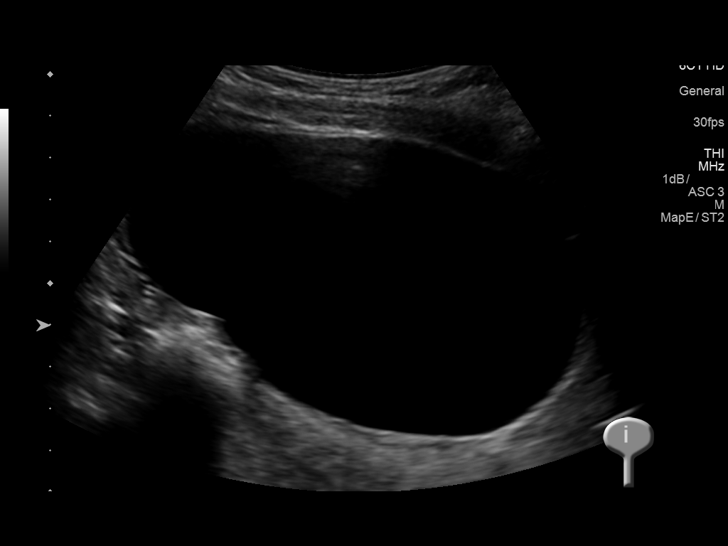
[im 32/39]
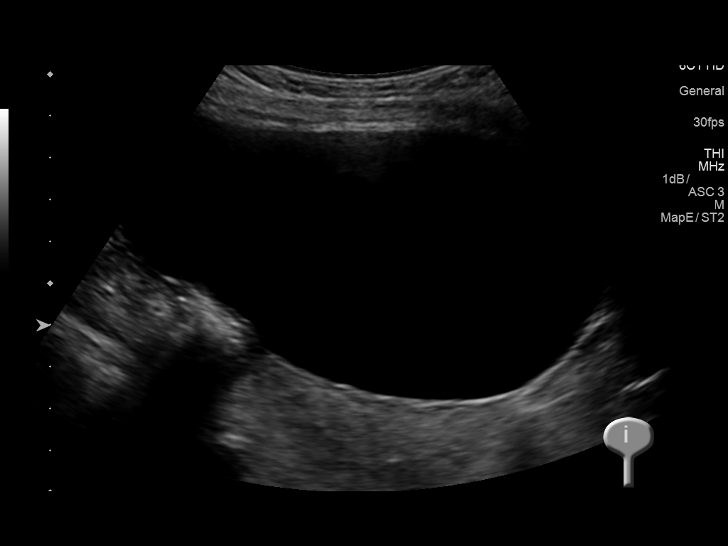
[im 35/39]
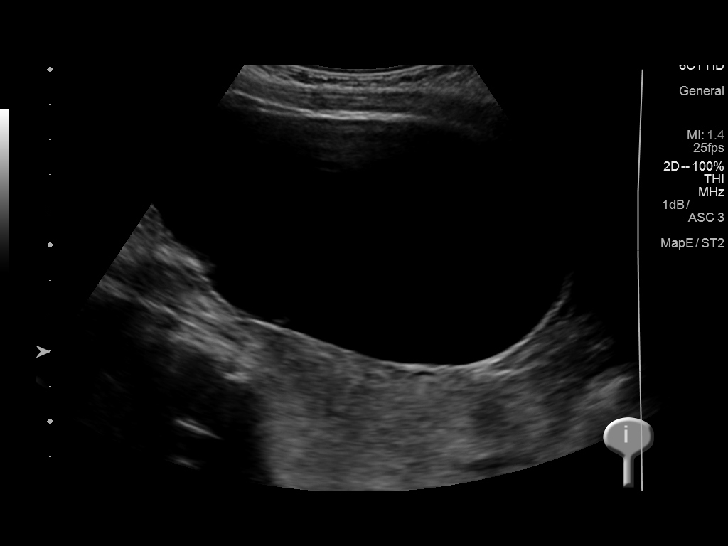
[im 39/39]
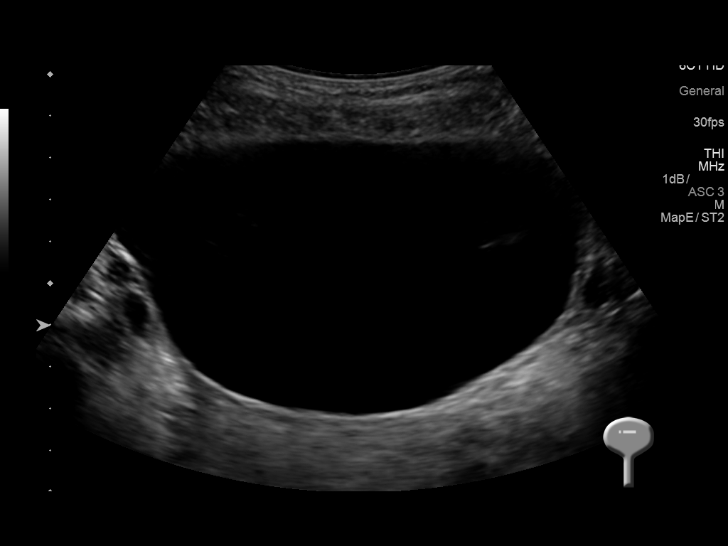

[14 of 25 positions shown; findings below may reference images not displayed]

FINDINGS: Right Kidney:

Length: 12.6 cm. Echogenicity within normal limits. Diffuse cortical
thinning is noted. No mass or hydronephrosis visualized.

Left Kidney:

Length: 10.3 cm. Echogenicity within normal limits. Diffuse cortical
thinning is noted. No mass or hydronephrosis visualized.

Bladder:

Significantly distended. The patient does not feel the need to void,
raising concern for urinary retention.
IMPRESSION: 1. No evidence of hydronephrosis.
2. Renal cortical thinning raises concern for chronic renal disease.
3. Significant bladder distention, despite recent attempt to void,
concerning for urinary retention.

## 2018-05-28 ENCOUNTER — Encounter (HOSPITAL_COMMUNITY): Payer: Self-pay | Admitting: Emergency Medicine

## 2018-05-28 ENCOUNTER — Other Ambulatory Visit: Payer: Self-pay

## 2018-05-28 ENCOUNTER — Emergency Department (HOSPITAL_COMMUNITY)
Admission: EM | Admit: 2018-05-28 | Discharge: 2018-05-29 | Disposition: A | Discharge status: Home / Self Care | Payer: Self-pay | Attending: Emergency Medicine | Admitting: Emergency Medicine

## 2018-05-28 DIAGNOSIS — M79604 Pain in right leg: Secondary | ICD-10-CM | POA: Insufficient documentation

## 2018-05-28 DIAGNOSIS — Z5321 Procedure and treatment not carried out due to patient leaving prior to being seen by health care provider: Secondary | ICD-10-CM | POA: Insufficient documentation

## 2018-05-28 LAB — CBC
HCT: 34.7 % — ABNORMAL LOW (ref 36.0–46.0)
HEMOGLOBIN: 11 g/dL — AB (ref 12.0–15.0)
MCH: 29.6 pg (ref 26.0–34.0)
MCHC: 31.7 g/dL (ref 30.0–36.0)
MCV: 93.5 fL (ref 78.0–100.0)
PLATELETS: 173 10*3/uL (ref 150–400)
RBC: 3.71 MIL/uL — ABNORMAL LOW (ref 3.87–5.11)
RDW: 13.3 % (ref 11.5–15.5)
WBC: 3.6 10*3/uL — AB (ref 4.0–10.5)

## 2018-05-28 LAB — BASIC METABOLIC PANEL
ANION GAP: 5 (ref 5–15)
BUN: 10 mg/dL (ref 6–20)
CALCIUM: 8.8 mg/dL — AB (ref 8.9–10.3)
CO2: 23 mmol/L (ref 22–32)
Chloride: 110 mmol/L (ref 98–111)
Creatinine, Ser: 0.93 mg/dL (ref 0.44–1.00)
GFR calc Af Amer: >60 mL/min (ref >60)
Glucose, Bld: 98 mg/dL (ref 70–99)
POTASSIUM: 3.6 mmol/L (ref 3.5–5.1)
SODIUM: 138 mmol/L (ref 135–145)

## 2018-05-28 LAB — I-STAT TROPONIN, ED: TROPONIN I, POC: 0 ng/mL (ref 0.00–0.08)

## 2018-05-28 LAB — I-STAT BETA HCG BLOOD, ED (MC, WL, AP ONLY)

## 2018-05-28 NOTE — ED Triage Notes (Addendum)
Per EMS, pt from home. Pt has Klippel-Trenaunay syndrome, d/t this pt has increased risk for blood clots. Pt has been c/o lower rt leg tingling/pins and needles feeling that has been going on for the last two days. Pt also reports a cramp to her right arm. Pt had an episode of cp/anxiety/dizziness on the way here which is when they called ems. No abnormalities noted to skin outside of baseline. Pt has hx of anxiety.

## 2018-05-29 NOTE — ED Notes (Signed)
Pt stated that she wanted to leave due to the long wait time. Pt stated she was going to check her MyChart to get her results. Pt seen leaving the ED

## 2018-05-29 NOTE — ED Notes (Signed)
Pt called for vitals with no response. 

## 2018-07-31 ENCOUNTER — Encounter (HOSPITAL_COMMUNITY): Payer: Self-pay | Admitting: Emergency Medicine

## 2018-07-31 ENCOUNTER — Emergency Department (HOSPITAL_COMMUNITY): Payer: Self-pay

## 2018-07-31 ENCOUNTER — Other Ambulatory Visit: Payer: Self-pay

## 2018-07-31 ENCOUNTER — Emergency Department (HOSPITAL_COMMUNITY)
Admission: EM | Admit: 2018-07-31 | Discharge: 2018-07-31 | Disposition: A | Discharge status: Home / Self Care | Payer: Self-pay | Attending: Emergency Medicine | Admitting: Emergency Medicine

## 2018-07-31 DIAGNOSIS — R0789 Other chest pain: Secondary | ICD-10-CM | POA: Insufficient documentation

## 2018-07-31 DIAGNOSIS — F17220 Nicotine dependence, chewing tobacco, uncomplicated: Secondary | ICD-10-CM | POA: Insufficient documentation

## 2018-07-31 DIAGNOSIS — R55 Syncope and collapse: Secondary | ICD-10-CM | POA: Insufficient documentation

## 2018-07-31 LAB — BASIC METABOLIC PANEL
ANION GAP: 7 (ref 5–15)
BUN: 10 mg/dL (ref 6–20)
CHLORIDE: 108 mmol/L (ref 98–111)
CO2: 24 mmol/L (ref 22–32)
Calcium: 8.7 mg/dL — ABNORMAL LOW (ref 8.9–10.3)
Creatinine, Ser: 1.02 mg/dL — ABNORMAL HIGH (ref 0.44–1.00)
GFR calc Af Amer: >60 mL/min (ref >60)
GLUCOSE: 93 mg/dL (ref 70–99)
POTASSIUM: 4.1 mmol/L (ref 3.5–5.1)
Sodium: 139 mmol/L (ref 135–145)

## 2018-07-31 LAB — CBC
HEMATOCRIT: 35.3 % — AB (ref 36.0–46.0)
HEMOGLOBIN: 11.1 g/dL — AB (ref 12.0–15.0)
MCH: 29.4 pg (ref 26.0–34.0)
MCHC: 31.4 g/dL (ref 30.0–36.0)
MCV: 93.6 fL (ref 80.0–100.0)
Platelets: 180 10*3/uL (ref 150–400)
RBC: 3.77 MIL/uL — AB (ref 3.87–5.11)
RDW: 14 % (ref 11.5–15.5)
WBC: 3.9 10*3/uL — ABNORMAL LOW (ref 4.0–10.5)
nRBC: 0 % (ref 0.0–0.2)

## 2018-07-31 LAB — I-STAT BETA HCG BLOOD, ED (MC, WL, AP ONLY): I-stat hCG, quantitative: <5 m[IU]/mL (ref <5)

## 2018-07-31 LAB — I-STAT TROPONIN, ED: Troponin i, poc: 0 ng/mL (ref 0.00–0.08)

## 2018-07-31 MED ORDER — IOPAMIDOL (ISOVUE-370) INJECTION 76%
100.0000 mL | Freq: Once | INTRAVENOUS | Status: AC | PRN
  Administered 2018-07-31: 100 mL via INTRAVENOUS

## 2018-07-31 MED ORDER — IOPAMIDOL (ISOVUE-370) INJECTION 76%
INTRAVENOUS | Status: AC
  Filled 2018-07-31: qty 100

## 2018-07-31 NOTE — ED Notes (Signed)
Patient transported to X-ray 

## 2018-07-31 NOTE — Discharge Instructions (Addendum)
You may use

## 2018-07-31 NOTE — ED Triage Notes (Signed)
Pt presents with sternal chest pain, sharp onset 1300 today after eating and drinking lunch. Pt states pain is constant and now radiating through to back.  Pt states she did have pain in posterior ribs yesterday.

## 2018-07-31 NOTE — ED Provider Notes (Signed)
[facility] EMERGENCY DEPARTMENT Provider Note   CSN: [id number] Arrival date & time: 07/31/18  1726     History   Chief Complaint Chief Complaint  Patient presents with  . Chest Pain    HPI Frances Reed is a 27 y.o. female.  HPI  The patient is a 27 year old female, she has generalized anxiety disorder and a history of a genetic disorder called Klippel-Trenaunay-Weber syndrome, she states that she was told when she was younger that she would be predisposed to blood clots but is never had one.  She has never had any cardiac problems, arrhythmias although she has been seen in the emergency department for chest pain as recently as 2 months ago but had a negative work-up.  She reports that today she has had some difficulty with chest pain, it was intermittent however at 1:00 it became constant, midsternal, does not seem to be positional exertional or related to deep breathing.  She states that it did seem to get worse when she swallowed.  That being said she had a syncopal episode in the car that lasted 2 minutes while she was being brought to the emergency department.  She no longer feels any symptoms related to the syncope and in fact had no prodromal symptoms that she remembers either.  Past Medical History:  Diagnosis Date  . Generalized anxiety disorder 06/13/2015  . Klippel-Trenaunay-Weber syndrome   . Klippel-Trenaunay-Weber syndrome   . Sleep apnea     Patient Active Problem List   Diagnosis Date Noted  . Pain of right lower leg 10/18/2015  . Generalized anxiety disorder 06/13/2015  . Carpal tunnel syndrome 01/18/2015  . 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 FOOT SURGERY Bilateral   . THROAT SURGERY    . TONSILLECTOMY       OB History    Gravida  1   Para  1   Term  1   Preterm      AB      Living  1     SAB      TAB      Ectopic      Multiple      Live Births  1             Home Medications    Prior to Admission medications   Medication Sig Start Date End Date Taking? Authorizing Provider  acetaminophen (TYLENOL) 500 MG tablet Take 1,000 mg by mouth every 6 (six) hours as needed for mild pain.   Yes [provider]  ondansetron (ZOFRAN ODT) 4 MG disintegrating tablet Take 1 tablet (4 mg total) by mouth every 8 (eight) hours as needed for nausea or vomiting. 04/10/17  Yes Ellyn Hack, MD  clonazePAM (KLONOPIN) 0.5 MG tablet Take 0.5 tablets (0.25 mg total) by mouth 2 (two) times daily as needed for anxiety. Patient not taking: Reported on 07/31/2018 04/10/17   Ellyn Hack, MD  gabapentin (NEURONTIN) 100 MG capsule TAKE 1 CAPSULE BY MOUTH TWICE A DAY Patient not taking: Reported on 07/31/2018 06/24/17   Ellyn Hack, MD  PARoxetine (PAXIL) 20 MG tablet Take 1 tablet (20 mg total) by mouth daily. Patient not taking: Reported on 07/31/2018 04/10/17   Ellyn Hack, MD    Family History No family history on file.  Social History Social History   Tobacco Use  . Smoking status: Never Smoker  . Smokeless tobacco:  Current User    Types: Chew  Substance Use Topics  . Alcohol use: Yes    Alcohol/week: 0.0 standard drinks    Comment: occ  . Drug use: No     Allergies   Coconut oil and Motrin [ibuprofen]   Review of Systems Review of Systems  All other systems reviewed and are negative.    Physical Exam Updated Vital Signs BP (!) 109/57 (BP Location: Right Arm)   Pulse 64   Resp 16   LMP 07/20/2018   SpO2 94%   Physical Exam  Constitutional: She appears well-developed and well-nourished. No distress.  HENT:  Head: Normocephalic and atraumatic.  Mouth/Throat: Oropharynx is clear and moist. No oropharyngeal exudate.  Eyes: Pupils are equal, round, and reactive to light. Conjunctivae and EOM are normal. Right eye exhibits no discharge. Left eye exhibits no discharge. No scleral icterus.  Neck: Normal  range of motion. Neck supple. No JVD present. No thyromegaly present.  Cardiovascular: Normal rate, regular rhythm, normal heart sounds and intact distal pulses. Exam reveals no gallop and no friction rub.  No murmur heard. Pulmonary/Chest: Effort normal and breath sounds normal. No respiratory distress. She has no wheezes. She has no rales.  Abdominal: Soft. Bowel sounds are normal. She exhibits no distension and no mass. There is no tenderness.  Musculoskeletal: Normal range of motion. She exhibits no edema or tenderness.  Lymphadenopathy:    She has no cervical adenopathy.  Neurological: She is alert. Coordination normal.  Skin: Skin is warm and dry. No rash noted. No erythema.  Psychiatric: She has a normal mood and affect. Her behavior is normal.  Nursing note and vitals reviewed.    ED Treatments / Results  Labs (all labs ordered are listed, but only abnormal results are displayed) Labs Reviewed  BASIC METABOLIC PANEL - Abnormal; Notable for the following components:      Result Value   Creatinine, Ser 1.02 (*)    Calcium 8.7 (*)    All other components within normal limits  CBC - Abnormal; Notable for the following components:   WBC 3.9 (*)    RBC 3.77 (*)    Hemoglobin 11.1 (*)    HCT 35.3 (*)    All other components within normal limits  I-STAT TROPONIN, ED  I-STAT BETA HCG BLOOD, ED ([facility], [facility], [facility] ONLY)    EKG EKG Interpretation  Date/Time:  Friday July 31 2018 18:24:39 EST Ventricular Rate:  62 PR Interval:  134 QRS Duration: 80 QT Interval:  406 QTC Calculation: 412 R Axis:   64 Text Interpretation:  Normal sinus rhythm Normal ECG since last tracing no significant change Confirmed by Eber Hong ([id number]) on 07/31/2018 7:16:28 PM   Radiology Dg Chest 2 View  Result Date: 07/31/2018 CLINICAL DATA:  Sternal chest pain beginning 1300 hours after eating. EXAM: CHEST - 2 VIEW COMPARISON:  None. FINDINGS: Heart size is normal. Mediastinal shadows are normal. No  pneumomediastinum. The lungs are clear. No effusions. No significant bone finding. Mild curvature in degenerative change of the spine. IMPRESSION: No active disease Electronically Signed   By: Paulina Fusi M.D.   On: 07/31/2018 19:10   Ct Angio Chest Pe W And/or Wo Contrast  Result Date: 07/31/2018 CLINICAL DATA:  Sternal chest pain onset at 1300 hours after eating and drinking. EXAM: CT ANGIOGRAPHY CHEST WITH CONTRAST TECHNIQUE: Multidetector CT imaging of the chest was performed using the standard protocol during bolus administration of intravenous contrast. Multiplanar CT image reconstructions and MIPs  were obtained to evaluate the vascular anatomy. CONTRAST:  100 cc ISOVUE-370 IOPAMIDOL (ISOVUE-370) INJECTION 76% COMPARISON:  None. FINDINGS: Cardiovascular: The study is of quality for the evaluation of pulmonary embolism. There are no filling defects in the central, lobar, segmental or subsegmental pulmonary artery branches to suggest acute pulmonary embolism. Great vessels are normal in course and caliber. Normal heart size. No significant pericardial fluid/thickening. Aortic aneurysm or dissection. Mediastinum/Nodes: No discrete thyroid nodules. Unremarkable esophagus. No pathologically enlarged axillary, mediastinal or hilar lymph nodes. Lungs/Pleura: No pneumothorax. No pleural effusion. Limited by motion artifacts. Bibasilar dependent atelectasis. No dominant mass or pulmonary consolidations. Upper abdomen: Unremarkable. Musculoskeletal:  No aggressive appearing focal osseous lesions. Review of the MIP images confirms the above findings. IMPRESSION: 1. No acute pulmonary embolus, aortic aneurysm or dissection. 2. No active pulmonary disease. Electronically Signed   By: Tollie Ethavid  Kwon M.D.   On: 07/31/2018 20:51    Procedures Procedures (including critical care time)  Medications Ordered in ED Medications  iopamidol (ISOVUE-370) 76 % injection 100 mL (100 mLs Intravenous Contrast Given 07/31/18 2015)      Initial Impression / Assessment and Plan / ED Course  I have reviewed the triage vital signs and the nursing notes.  Pertinent labs & imaging results that were available during my care of the patient were reviewed by me and considered in my medical decision making (see chart for details).    The patient's EKG is very normal, the chest x-ray is unremarkable, her lab work was also reassuring however I am concerned given the patient's history of syncope prehospital that this could be something more serious.  She was ultimately determined to have more of a panic or anxiety attack in October when she was here with chest pain but states that she has no reason to feel that way today.  We will pursue further testing with a CT angiogram, the patient is in agreement  The patient is agreeable to return should symptoms worsen, CT scan negative, unlikely to be acute cardiac or pulmonary etiology  Final Clinical Impressions(s) / ED Diagnoses   Final diagnoses:  Atypical chest pain      Eber HongMiller, Joshus Rogan, MD 07/31/18 2115

## 2018-07-31 NOTE — ED Notes (Addendum)
Pt's EKG done in triage, pt's MRN number put in incorrectly in EKG monitor. In pt's MRN I put a 8 instead of a 9. Reason for EKG not crossing over. Shot a new EKG, EKG team to be called in the morning for first EKG to be fixed.

## 2018-11-18 ENCOUNTER — Other Ambulatory Visit: Payer: Self-pay

## 2018-11-18 ENCOUNTER — Emergency Department (HOSPITAL_COMMUNITY)
Admission: EM | Admit: 2018-11-18 | Discharge: 2018-11-18 | Disposition: A | Discharge status: Home / Self Care | Payer: Medicaid Other | Attending: Emergency Medicine | Admitting: Emergency Medicine

## 2018-11-18 ENCOUNTER — Encounter (HOSPITAL_COMMUNITY): Payer: Self-pay | Admitting: Emergency Medicine

## 2018-11-18 DIAGNOSIS — J069 Acute upper respiratory infection, unspecified: Secondary | ICD-10-CM | POA: Insufficient documentation

## 2018-11-18 NOTE — Discharge Instructions (Addendum)
You were evaluated in the Emergency Department and after careful evaluation, we did not find any emergent condition requiring admission or further testing in the hospital.  Your symptoms today seem to be due to a viral illness.  Because of your lack of travel history or exposure, it is very unlikely that you have the coronavirus.  However, because it is still possible, we recommend home isolation for the next several days.  We recommend at least 7 days of home isolation from the time he became ill.  We also recommend at least 72 hours without fever prior to returning to normal daily activities.  Use Tylenol or ibuprofen at home for discomfort drink plenty of fluids.  Please return to the Emergency Department if you experience any worsening of your condition.  We encourage you to follow up with a primary care provider.  Thank you for allowing [place] to be a part of your care.

## 2018-11-18 NOTE — ED Provider Notes (Signed)
[facility] Emergency Department Provider Note MRN:  [id number]  Arrival date & time: 11/18/18     Chief Complaint   Sore Throat and Chills   History of Present Illness   Frances Reed is a 28 y.o. year-old female with a history of Cabbell Trenaunay Weber syndrome presenting to the ED with chief complaint of sore throat.  Patient became ill 3 days ago, endorsing dry cough, sore throat, nasal congestion.  Fever up to 101 yesterday.  Denies chest pain or shortness of breath, no abdominal pain, no dysuria, no rash, no headache or vision change, no neck pain.  Works at a rehab facility, no known sick contacts, no recent travel.  Symptoms are constant, no exacerbating relieving factors.  Review of Systems  A complete 10 system review of systems was obtained and all systems are negative except as noted in the HPI and PMH.   Patient's Health History    Past Medical History:  Diagnosis Date  . Generalized anxiety disorder 06/13/2015  . Klippel-Trenaunay-Weber syndrome   . Klippel-Trenaunay-Weber syndrome   . Sleep apnea     Past Surgical History:  Procedure Laterality Date  . FOOT SURGERY Bilateral   . THROAT SURGERY    . TONSILLECTOMY      No family history on file.  Social History   Socioeconomic History  . Marital status: Single    Spouse name: Not on file  . Number of children: Not on file  . Years of education: Not on file  . Highest education level: Not on file  Occupational History  . Not on file  Social Needs  . Financial resource strain: Not on file  . Food insecurity:    Worry: Not on file    Inability: Not on file  . Transportation needs:    Medical: Not on file    Non-medical: Not on file  Tobacco Use  . Smoking status: Never Smoker  . Smokeless tobacco: Current User    Types: Chew  Substance and Sexual Activity  . Alcohol use: Yes    Alcohol/week: 0.0 standard drinks    Comment: occ  . Drug use: No  . Sexual activity: Yes   Partners: Female    Birth control/protection: None  Lifestyle  . Physical activity:    Days per week: Not on file    Minutes per session: Not on file  . Stress: Not on file  Relationships  . Social connections:    Talks on phone: Not on file    Gets together: Not on file    Attends religious service: Not on file    Active member of club or organization: Not on file    Attends meetings of clubs or organizations: Not on file    Relationship status: Not on file  . Intimate partner violence:    Fear of current or ex partner: Not on file    Emotionally abused: Not on file    Physically abused: Not on file    Forced sexual activity: Not on file  Other Topics Concern  . Not on file  Social History Narrative  . Not on file     Physical Exam  Vital Signs and Nursing Notes reviewed Vitals:   11/18/18 1558  BP: (!) 138/98  Pulse: 86  Resp: 16  Temp: 98.8 F (37.1 C)  SpO2: 99%    CONSTITUTIONAL: Well-appearing, NAD NEURO:  Alert and oriented x 3, no focal deficits EYES:  eyes equal and reactive ENT/NECK:  no LAD, no JVD CARDIO: Regular rate, well-perfused, normal S1 and S2 PULM:  CTAB no wheezing or rhonchi GI/GU:  normal bowel sounds, non-distended, non-tender MSK/SPINE:  No gross deformities, no edema SKIN:  no rash, atraumatic PSYCH:  Appropriate speech and behavior  Diagnostic and Interventional Summary    Labs Reviewed - No data to display  No orders to display    Medications - No data to display   Procedures Critical Care  ED Course and Medical Decision Making  I have reviewed the triage vital signs and the nursing notes.  Pertinent labs & imaging results that were available during my care of the patient were reviewed by me and considered in my medical decision making (see below for details).  Consistent with upper respiratory infection, normal vital signs, clear lungs with little to no concern for pneumonia, no meningismus.  Patient is low risk without  travel or sick contacts, per our protocols coronavirus testing not indicated.  Advised home isolation.  After the discussed management above, the patient was determined to be safe for discharge.  The patient was in agreement with this plan and all questions regarding their care were answered.  ED return precautions were discussed and the patient will return to the ED with any significant worsening of condition.  Elmer Sow. Pilar Plate, MD [facility] Emergency Medicine [facility] [email or web address]  Final Clinical Impressions(s) / ED Diagnoses     ICD-10-CM   1. Upper respiratory tract infection, unspecified type J06.9     ED Discharge Orders    None         Sabas Sous, MD 11/18/18 [phone]

## 2018-11-18 NOTE — ED Triage Notes (Signed)
Pt arrives to ED with c/o of cough/ sore throat and chills. 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

## 2018-12-22 ENCOUNTER — Encounter (HOSPITAL_COMMUNITY): Payer: Self-pay | Admitting: Emergency Medicine

## 2018-12-22 ENCOUNTER — Emergency Department (HOSPITAL_COMMUNITY)
Admission: EM | Admit: 2018-12-22 | Discharge: 2018-12-22 | Disposition: A | Discharge status: Home / Self Care | Payer: Self-pay | Attending: Emergency Medicine | Admitting: Emergency Medicine

## 2018-12-22 ENCOUNTER — Other Ambulatory Visit: Payer: Self-pay

## 2018-12-22 DIAGNOSIS — F17290 Nicotine dependence, other tobacco product, uncomplicated: Secondary | ICD-10-CM | POA: Insufficient documentation

## 2018-12-22 DIAGNOSIS — J029 Acute pharyngitis, unspecified: Secondary | ICD-10-CM | POA: Insufficient documentation

## 2018-12-22 DIAGNOSIS — R509 Fever, unspecified: Secondary | ICD-10-CM | POA: Insufficient documentation

## 2018-12-22 DIAGNOSIS — B2790 Infectious mononucleosis, unspecified without complication: Secondary | ICD-10-CM

## 2018-12-22 DIAGNOSIS — R05 Cough: Secondary | ICD-10-CM | POA: Insufficient documentation

## 2018-12-22 DIAGNOSIS — R1012 Left upper quadrant pain: Secondary | ICD-10-CM

## 2018-12-22 NOTE — Discharge Instructions (Signed)
You were seen in the ED today for left upper quadrant abdominal pain; your bedside ultrasound showed an enlarged spleen consistent with your mono that you tested positive for at work. Please take Ibuprofen and Tylenol as needed for pain. Refrain from contact sports for the next 4 weeks. 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   Follow up with your PCP, if you do not have one you may follow up with [facility]

## 2018-12-22 NOTE — ED Provider Notes (Signed)
[facility] EMERGENCY DEPARTMENT Provider Note   CSN: [id number] Arrival date & time: 12/22/18  1216    History   Chief Complaint Chief Complaint  Patient presents with  . Abdominal Pain  . Sore Throat  . Cough  . Nasal Congestion    HPI Frances Reed is a 28 y.o. female who presents to the ED with gradual onset, intermittent, achy, LUQ abdominal pain x 1 week. Pt reports she was having a sore throat and a cough x 1 month ago, seen initially in the ED with flu like symptoms. 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 Pt was told she had mono over a week ago prior to her pain beginning. She comes to the ED today after initially going to [facility] to make sure that this pain is from her mono and not something else. Pt states all of her other symptoms have improved. She no longer has a sore throat, cough, or fever. Denies nausea, vomiting, diarrhea, constipation, dysuria, frequency, or any other associated symptoms.        Past Medical History:  Diagnosis Date  . Generalized anxiety disorder 06/13/2015  . Klippel-Trenaunay-Weber syndrome   . Klippel-Trenaunay-Weber syndrome   . Sleep apnea     Patient Active Problem List   Diagnosis Date Noted  . Pain of right lower leg 10/18/2015  . Generalized anxiety disorder 06/13/2015  . Carpal tunnel syndrome 01/18/2015  . 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 FOOT SURGERY Bilateral   . THROAT SURGERY    . TONSILLECTOMY       OB History    Gravida  1   Para  1   Term  1   Preterm      AB      Living  1     SAB      TAB      Ectopic      Multiple      Live Births  1            Home Medications    Prior to Admission medications    Medication Sig Start Date End Date Taking? Authorizing Provider  acetaminophen (TYLENOL) 500 MG tablet Take 1,000 mg by mouth every 6 (six) hours as needed for mild pain.    [provider]  clonazePAM (KLONOPIN) 0.5 MG tablet Take 0.5 tablets (0.25 mg total) by mouth 2 (two) times daily as needed for anxiety. Patient not taking: Reported on 07/31/2018 04/10/17   Ellyn Hack, MD  gabapentin (NEURONTIN) 100 MG capsule TAKE 1 CAPSULE BY MOUTH TWICE A DAY Patient not taking: Reported on 07/31/2018 06/24/17   Ellyn Hack, MD  ondansetron (ZOFRAN ODT) 4 MG disintegrating tablet Take 1 tablet (4 mg total) by mouth every 8 (eight) hours as needed for nausea or vomiting. 04/10/17   Ellyn Hack, MD  PARoxetine (PAXIL) 20 MG tablet Take 1 tablet (20 mg total) by mouth daily. Patient not taking: Reported on 07/31/2018 04/10/17   Ellyn Hack, MD    Family History No family history on file.  Social History Social History   Tobacco Use  . Smoking status: Never Smoker  .  Smokeless tobacco: Current User    Types: Chew  Substance Use Topics  . Alcohol use: Yes    Alcohol/week: 0.0 standard drinks    Comment: occ  . Drug use: No     Allergies   Coconut oil and Motrin [ibuprofen]   Review of Systems Review of Systems  Constitutional: Negative for chills and fever.  HENT: Negative for congestion and sore throat.   Eyes: Negative for redness.  Respiratory: Negative for cough and shortness of breath.   Cardiovascular: Negative for chest pain.  Gastrointestinal: Positive for abdominal pain. Negative for constipation, diarrhea, nausea and vomiting.  Genitourinary: Negative for dysuria, flank pain, frequency and hematuria.  Musculoskeletal: Negative for myalgias.  Skin: Negative for rash.  Neurological: Negative for headaches.     Physical Exam Updated Vital Signs BP 113/84 (BP Location: Right Arm)   Pulse 86   Temp 98.4 F (36.9 C) (Oral)   Resp 18   Ht 5'  5" (1.651 m)   LMP 12/02/2018   SpO2 100%   BMI 26.63 kg/m   Physical Exam Vitals signs and nursing note reviewed.  Constitutional:      Appearance: She is not ill-appearing.  HENT:     Head: Normocephalic and atraumatic.     Mouth/Throat:     Mouth: Mucous membranes are moist.     Pharynx: No pharyngeal swelling or oropharyngeal exudate.  Eyes:     Conjunctiva/sclera: Conjunctivae normal.  Neck:     Musculoskeletal: Neck supple.  Cardiovascular:     Rate and Rhythm: Normal rate and regular rhythm.     Pulses: Normal pulses.     Heart sounds: No murmur.  Pulmonary:     Effort: Pulmonary effort is normal.     Breath sounds: Normal breath sounds. No wheezing, rhonchi or rales.  Abdominal:     Palpations: Abdomen is soft.     Tenderness: There is no abdominal tenderness. There is no right CVA tenderness or left CVA tenderness.     Comments: No obvious tenderness to palpation in LUQ  Lymphadenopathy:     Cervical: No cervical adenopathy.  Skin:    General: Skin is warm and dry.  Neurological:     Mental Status: She is alert.      ED Treatments / Results  Labs (all labs ordered are listed, but only abnormal results are displayed) Labs Reviewed - No data to display  EKG None  Radiology No results found.  Procedures Ultrasound ED Abd Date/Time: 12/22/2018 1:18 PM Performed by: Tanda RockersVenter, Suhani Stillion, PA-C Authorized by: Sabas SousBero, Michael M, MD   Procedure details:    Indications: abdominal pain     Assessment for:  Intra-abdominal fluid   Left renal:  Visualized   Images: archived    Left renal findings:    Mass: not identified     Nephrolithiasis: not identified     Renal stones: not identified     Ureteral jets: not identified     Intra-abdominal fluid: not identified     Perinephric fluid: not identified     Hydronephrosis: none   Comments:     Visualization of spleen shows splenomegaly; no free fluid   (including critical care time)  Medications Ordered in  ED Medications - No data to display   Initial Impression / Assessment and Plan / ED Course  I have reviewed the triage vital signs and the nursing notes.  Pertinent labs & imaging results that were available during my care of the patient were  reviewed by me and considered in my medical decision making (see chart for details).    Pt is a 28 year old female who presents for LUQ abdominal pain after being diagnosed with EBV at work over a week ago; has resolution in all of her infectious symptoms, currently only complaining of intermittent LUQ pain. She is non tender to palpation; afebrile in the ED today. Was able to have patient obtain test records from her job which did in fact pick up EBV with her nasopharyngeal swab. LUQ pain consistent with mono; no free fluid visualized on bedside ultrasound. 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 Advised patient to avoid trauma to her spleen; she is in understanding. Strict return precautions discussed; patient stable for discharge home.        Final Clinical Impressions(s) / ED Diagnoses   Final diagnoses:  LUQ abdominal pain  Infectious mononucleosis without complication, infectious mononucleosis due to unspecified organism    ED Discharge Orders    None       Tanda Rockers, PA-C 12/22/18 1526    Sabas Sous, MD 12/27/18 [phone]

## 2018-12-22 NOTE — ED Triage Notes (Signed)
Pt. 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 Im still having same symptoms, cough, sore throat, runny nose for a month. I did have a fever but no more.

## 2019-04-09 ENCOUNTER — Other Ambulatory Visit: Payer: Self-pay

## 2019-04-09 ENCOUNTER — Encounter (HOSPITAL_COMMUNITY): Payer: Self-pay | Admitting: Emergency Medicine

## 2019-04-09 ENCOUNTER — Emergency Department (HOSPITAL_COMMUNITY)
Admission: EM | Admit: 2019-04-09 | Discharge: 2019-04-09 | Disposition: A | Discharge status: Home / Self Care | Payer: Medicaid Other | Attending: Emergency Medicine | Admitting: Emergency Medicine

## 2019-04-09 DIAGNOSIS — R55 Syncope and collapse: Secondary | ICD-10-CM | POA: Insufficient documentation

## 2019-04-09 DIAGNOSIS — Z5321 Procedure and treatment not carried out due to patient leaving prior to being seen by health care provider: Secondary | ICD-10-CM | POA: Insufficient documentation

## 2019-04-09 LAB — CBC
HCT: 34.9 % — ABNORMAL LOW (ref 36.0–46.0)
Hemoglobin: 11.6 g/dL — ABNORMAL LOW (ref 12.0–15.0)
MCH: 31.2 pg (ref 26.0–34.0)
MCHC: 33.2 g/dL (ref 30.0–36.0)
MCV: 93.8 fL (ref 80.0–100.0)
Platelets: 185 10*3/uL (ref 150–400)
RBC: 3.72 MIL/uL — ABNORMAL LOW (ref 3.87–5.11)
RDW: 13.3 % (ref 11.5–15.5)
WBC: 4.4 10*3/uL (ref 4.0–10.5)
nRBC: 0 % (ref 0.0–0.2)

## 2019-04-09 LAB — URINALYSIS, ROUTINE W REFLEX MICROSCOPIC
Bilirubin Urine: NEGATIVE
Glucose, UA: NEGATIVE mg/dL
Ketones, ur: NEGATIVE mg/dL
Nitrite: NEGATIVE
Protein, ur: NEGATIVE mg/dL
Specific Gravity, Urine: 1.006 (ref 1.005–1.030)
pH: 7 (ref 5.0–8.0)

## 2019-04-09 LAB — BASIC METABOLIC PANEL
Anion gap: 7 (ref 5–15)
BUN: 15 mg/dL (ref 6–20)
CO2: 24 mmol/L (ref 22–32)
Calcium: 8.9 mg/dL (ref 8.9–10.3)
Chloride: 106 mmol/L (ref 98–111)
Creatinine, Ser: 0.94 mg/dL (ref 0.44–1.00)
GFR calc Af Amer: >60 mL/min (ref >60)
GFR calc non Af Amer: >60 mL/min (ref >60)
Glucose, Bld: 94 mg/dL (ref 70–99)
Potassium: 3.7 mmol/L (ref 3.5–5.1)
Sodium: 137 mmol/L (ref 135–145)

## 2019-04-09 LAB — I-STAT BETA HCG BLOOD, ED (MC, WL, AP ONLY): I-stat hCG, quantitative: <5 m[IU]/mL (ref <5)

## 2019-04-09 MED ORDER — SODIUM CHLORIDE 0.9% FLUSH: 3.0000 mL | Freq: Once | INTRAVENOUS | Status: DC

## 2019-04-09 NOTE — ED Triage Notes (Signed)
Patient here from home with syncopal episode.  She was at home, in bed watching videos, girlfriend tried to wake her up but was unable to.  Patient has chronic back pain, headache after hitting her head on the wall after syncopal episode.  Patient is CAOx4, GCS of 15 with EMS and upon arrival to ED.

## 2019-04-09 NOTE — ED Notes (Signed)
Pt did not stop at Desk to talk to staff but Staff noticed Patient getting into a Vehicle and leaving.

## 2019-05-17 ENCOUNTER — Emergency Department (HOSPITAL_COMMUNITY)
Admission: EM | Admit: 2019-05-17 | Discharge: 2019-05-17 | Disposition: A | Discharge status: Home / Self Care | Payer: Medicaid Other | Attending: Emergency Medicine | Admitting: Emergency Medicine

## 2019-05-17 ENCOUNTER — Encounter (HOSPITAL_COMMUNITY): Payer: Self-pay | Admitting: Emergency Medicine

## 2019-05-17 DIAGNOSIS — R309 Painful micturition, unspecified: Secondary | ICD-10-CM | POA: Insufficient documentation

## 2019-05-17 DIAGNOSIS — R110 Nausea: Secondary | ICD-10-CM | POA: Insufficient documentation

## 2019-05-17 DIAGNOSIS — Z5321 Procedure and treatment not carried out due to patient leaving prior to being seen by health care provider: Secondary | ICD-10-CM | POA: Insufficient documentation

## 2019-05-17 DIAGNOSIS — R109 Unspecified abdominal pain: Secondary | ICD-10-CM | POA: Insufficient documentation

## 2019-05-17 LAB — URINALYSIS, ROUTINE W REFLEX MICROSCOPIC
Bilirubin Urine: NEGATIVE
Glucose, UA: NEGATIVE mg/dL
Ketones, ur: NEGATIVE mg/dL
Leukocytes,Ua: NEGATIVE
Nitrite: NEGATIVE
Protein, ur: NEGATIVE mg/dL
Specific Gravity, Urine: 1.031 — ABNORMAL HIGH (ref 1.005–1.030)
pH: 5 (ref 5.0–8.0)

## 2019-05-17 LAB — POC URINE PREG, ED: Preg Test, Ur: NEGATIVE

## 2019-05-17 NOTE — ED Triage Notes (Signed)
Pt. Stated,Ive had left side stomach pain, nausea, and it burns when I pee. This started this morning.

## 2019-05-18 ENCOUNTER — Other Ambulatory Visit: Payer: Self-pay

## 2019-05-18 ENCOUNTER — Other Ambulatory Visit: Payer: Self-pay | Admitting: Physician Assistant

## 2019-05-18 ENCOUNTER — Encounter (HOSPITAL_COMMUNITY): Payer: Self-pay

## 2019-05-18 ENCOUNTER — Emergency Department (HOSPITAL_COMMUNITY): Payer: Self-pay

## 2019-05-18 ENCOUNTER — Emergency Department (HOSPITAL_COMMUNITY)
Admission: EM | Admit: 2019-05-18 | Discharge: 2019-05-18 | Disposition: A | Discharge status: Home / Self Care | Payer: Self-pay | Attending: Emergency Medicine | Admitting: Emergency Medicine

## 2019-05-18 DIAGNOSIS — B9689 Other specified bacterial agents as the cause of diseases classified elsewhere: Secondary | ICD-10-CM

## 2019-05-18 DIAGNOSIS — N760 Acute vaginitis: Secondary | ICD-10-CM | POA: Insufficient documentation

## 2019-05-18 DIAGNOSIS — N83202 Unspecified ovarian cyst, left side: Secondary | ICD-10-CM | POA: Insufficient documentation

## 2019-05-18 LAB — COMPREHENSIVE METABOLIC PANEL
ALT: 12 U/L (ref 0–44)
AST: 15 U/L (ref 15–41)
Albumin: 4 g/dL (ref 3.5–5.0)
Alkaline Phosphatase: 44 U/L (ref 38–126)
Anion gap: 9 (ref 5–15)
BUN: 13 mg/dL (ref 6–20)
CO2: 23 mmol/L (ref 22–32)
Calcium: 8.9 mg/dL (ref 8.9–10.3)
Chloride: 103 mmol/L (ref 98–111)
Creatinine, Ser: 0.78 mg/dL (ref 0.44–1.00)
GFR calc Af Amer: >60 mL/min (ref >60)
GFR calc non Af Amer: >60 mL/min (ref >60)
Glucose, Bld: 90 mg/dL (ref 70–99)
Potassium: 3.8 mmol/L (ref 3.5–5.1)
Sodium: 135 mmol/L (ref 135–145)
Total Bilirubin: 0.9 mg/dL (ref 0.3–1.2)
Total Protein: 6.9 g/dL (ref 6.5–8.1)

## 2019-05-18 LAB — CBC
HCT: 38.3 % (ref 36.0–46.0)
Hemoglobin: 12.2 g/dL (ref 12.0–15.0)
MCH: 31 pg (ref 26.0–34.0)
MCHC: 31.9 g/dL (ref 30.0–36.0)
MCV: 97.2 fL (ref 80.0–100.0)
Platelets: 204 10*3/uL (ref 150–400)
RBC: 3.94 MIL/uL (ref 3.87–5.11)
RDW: 13.4 % (ref 11.5–15.5)
WBC: 3.4 10*3/uL — ABNORMAL LOW (ref 4.0–10.5)
nRBC: 0 % (ref 0.0–0.2)

## 2019-05-18 LAB — URINALYSIS, ROUTINE W REFLEX MICROSCOPIC
Bilirubin Urine: NEGATIVE
Glucose, UA: NEGATIVE mg/dL
Ketones, ur: NEGATIVE mg/dL
Nitrite: NEGATIVE
Protein, ur: NEGATIVE mg/dL
Specific Gravity, Urine: 1.023 (ref 1.005–1.030)
pH: 5 (ref 5.0–8.0)

## 2019-05-18 LAB — I-STAT BETA HCG BLOOD, ED (MC, WL, AP ONLY): I-stat hCG, quantitative: <5 m[IU]/mL (ref <5)

## 2019-05-18 LAB — WET PREP, GENITAL
Sperm: NONE SEEN
Trich, Wet Prep: NONE SEEN
Yeast Wet Prep HPF POC: NONE SEEN

## 2019-05-18 LAB — LIPASE, BLOOD: Lipase: 20 U/L (ref 11–51)

## 2019-05-18 MED ORDER — ONDANSETRON HCL 4 MG PO TABS: 4.0000 mg | ORAL_TABLET | Freq: Three times a day (TID) | ORAL | 0 refills | Status: DC | PRN

## 2019-05-18 MED ORDER — SODIUM CHLORIDE 0.9% FLUSH: 3.0000 mL | Freq: Once | INTRAVENOUS | Status: DC

## 2019-05-18 MED ORDER — MORPHINE SULFATE (PF) 4 MG/ML IV SOLN
4.0000 mg | Freq: Once | INTRAVENOUS | Status: AC
  Administered 2019-05-18: 12:00:00 4 mg via INTRAMUSCULAR
  Filled 2019-05-18: qty 1

## 2019-05-18 MED ORDER — HYDROCODONE-ACETAMINOPHEN 5-325 MG PO TABS: 2.0000 | ORAL_TABLET | ORAL | 0 refills | Status: DC | PRN

## 2019-05-18 MED ORDER — ONDANSETRON 4 MG PO TBDP
4.0000 mg | ORAL_TABLET | Freq: Once | ORAL | Status: AC
  Administered 2019-05-18: 4 mg via ORAL
  Filled 2019-05-18: qty 1

## 2019-05-18 MED ORDER — METRONIDAZOLE 500 MG PO TABS: 500.0000 mg | ORAL_TABLET | Freq: Two times a day (BID) | ORAL | 0 refills | Status: AC

## 2019-05-18 MED ORDER — KETOROLAC TROMETHAMINE 30 MG/ML IJ SOLN
30.0000 mg | Freq: Once | INTRAMUSCULAR | Status: AC
  Administered 2019-05-18: 30 mg via INTRAMUSCULAR
  Filled 2019-05-18: qty 1

## 2019-05-18 MED ORDER — HYDROCODONE-ACETAMINOPHEN 5-325 MG PO TABS: 1.0000 | ORAL_TABLET | Freq: Four times a day (QID) | ORAL | 0 refills | Status: DC | PRN

## 2019-05-18 NOTE — Discharge Instructions (Addendum)
Prescription given for Norco. Take medication as directed and do not operate machinery, drive a car, or work while taking this medication as it can make you drowsy.   You have been tested for chlamydia and gonorrhea.  These results will be available in approximately 3 days and you will be contacted by the hospital if the results are positive. Avoid sexual contact until you are aware of the results, and please inform all sexual partners if you test positive for any of these diseases.  You were given a prescription for antibiotics. Please take the antibiotic prescription fully. Do not drink alcohol while taking these medications.  Please follow up with an OB-GYN within 5-7 days for re-evaluation of your symptoms.   Please return to the emergency department for any new or worsening symptoms.

## 2019-05-18 NOTE — ED Provider Notes (Signed)
[facility]-EMERGENCY DEPT Provider Note   CSN: [id number] Arrival date & time: 05/18/19  0744     History   Chief Complaint Chief Complaint  Patient presents with   Abdominal Pain   Dysuria    HPI Frances Reed is a 28 y.o. female.     HPI   Pt is a 28 y/o female with a h/o UTI, nephrolithiasis, appendectomy, who presents to the ED today for eval of LLQ abd pain since yesterday morning. Pain seems to wax and wane. Pain is sharp. Pain seems to improve when she relaxes and is not moving. Rates pain 6-7/10. She initially had some radiation of pain to the left flank however pain is no longer radiating.  She also reports nausea and vomiting x1. Denies associated fevers, constipation. Denies abnormal vaginal bleeding or vaginal discharge. She she had some dysuria initially but this has resolved. Denies frequency, urgency, or hematuria. Pt is monogamous with her female partner at bedside and denies concern for STD.   LMP was 1 week ago.   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 Authorizing Provider  acetaminophen (TYLENOL) 500 MG tablet Take 1,000 mg by mouth every 6 (six) hours as needed for mild pain.    Yes [provider]  clonazePAM (KLONOPIN) 0.5 MG tablet Take 0.5 tablets (0.25 mg total) by mouth 2 (two) times daily as needed for anxiety. Patient taking differently: Take 0.5 mg by mouth daily as needed for anxiety.  04/10/17  Yes Ellyn Hack, MD  gabapentin (NEURONTIN) 100 MG capsule Take 200 mg by mouth daily.   Yes [provider]  PARoxetine (PAXIL) 10 MG tablet Take 10 mg by mouth daily.   Yes [provider]  PARoxetine (PAXIL) 20 MG tablet Take 1 tablet (20 mg total) by mouth daily. 04/10/17  Yes Ellyn Hack, MD  gabapentin (NEURONTIN) 100 MG capsule TAKE 1 CAPSULE BY MOUTH TWICE A DAY Patient not taking: Reported on 07/31/2018 06/24/17   Ellyn Hack, MD  HYDROcodone-acetaminophen (NORCO/VICODIN) 5-325 MG tablet Take 1 tablet by mouth every 6 (six) hours as needed. 05/18/19   Shanee Batch S, PA-C  metroNIDAZOLE (FLAGYL) 500 MG tablet Take 1 tablet (500 mg total) by mouth 2 (two) times daily for 7 days. 05/18/19 05/25/19  Kiran Carline S, PA-C  ondansetron (ZOFRAN ODT) 4 MG disintegrating tablet Take 1 tablet (4 mg total) by  mouth every 8 (eight) hours as needed for nausea or vomiting. Patient not taking: Reported on 05/18/2019 04/10/17   Ellyn Hack, MD  ondansetron (ZOFRAN) 4 MG tablet Take 1 tablet (4 mg total) by mouth every 8 (eight) hours as needed for nausea or vomiting. 05/18/19   Mileah Hemmer S, PA-C    Family History No family history on file.  Social History Social History   Tobacco Use   Smoking status: Never Smoker   Smokeless tobacco: Current User    Types: Chew  Substance Use Topics   Alcohol use: Yes    Alcohol/week: 0.0 standard drinks    Comment: occ   Drug use: No     Allergies   Coconut oil and Motrin [ibuprofen]   Review of Systems Review of Systems  Constitutional: Negative for fever.  HENT: Negative for ear pain and sore throat.   Eyes: Negative for visual disturbance.  Respiratory:  Negative for cough and shortness of breath.   Cardiovascular: Negative for chest pain.  Gastrointestinal: Positive for abdominal pain, nausea and vomiting. Negative for constipation and diarrhea.  Genitourinary: Positive for dysuria. Negative for frequency, hematuria, urgency, vaginal bleeding and vaginal discharge.  Musculoskeletal: Negative for back pain.  Skin: Negative for rash.  Neurological: Negative for headaches.  All other systems reviewed and are negative.    Physical Exam Updated Vital Signs BP (!) 132/94    Pulse 83    Temp 98.6 F (37 C) (Oral)    Resp 16    Wt 83.7 kg    LMP 05/10/2019    SpO2 100%    BMI 33.75 kg/m   Physical Exam Vitals signs and nursing note reviewed.  Constitutional:      General: She is not in acute distress.    Appearance: She is well-developed.  HENT:     Head: Normocephalic and atraumatic.  Eyes:     Conjunctiva/sclera: Conjunctivae normal.  Neck:     Musculoskeletal: Neck supple.  Cardiovascular:     Rate and Rhythm: Normal rate and regular rhythm.     Heart sounds: No murmur.  Pulmonary:     Effort: Pulmonary effort is normal. No respiratory distress.     Breath sounds: Normal breath sounds.  Abdominal:     General: Bowel sounds are normal.     Palpations: Abdomen is soft.     Tenderness: There is abdominal tenderness in the left lower quadrant. There is left CVA tenderness. There is no right CVA tenderness.  Genitourinary:    Comments: Exam performed by Karrie Meres,  exam chaperoned Date: 05/18/2019 Pelvic exam: normal external genitalia without evidence of trauma. VULVA: normal appearing vulva with no masses, tenderness or lesion. VAGINA: normal appearing vagina with normal color and no lesions. +discharge CERVIX: normal appearing cervix without lesions, cervical motion tenderness absent, cervical os closed with out purulent discharge; vaginal discharge - thin white/yellow discharge, Wet prep and DNA probe for chlamydia and  GC obtained.   ADNEXA: normal adnexa in size, and no masses, TTP to the left adnexa UTERUS: uterus is normal size, shape, consistency and nontender.  Skin:    General: Skin is warm and dry.  Neurological:     Mental Status: She is alert.     ED Treatments / Results  Labs (all labs ordered are listed, but only abnormal results are displayed) Labs Reviewed  WET PREP, GENITAL - Abnormal; Notable for the following components:      Result Value   Clue  Cells Wet Prep HPF POC PRESENT (*)    WBC, Wet Prep HPF POC MODERATE (*)    All other components within normal limits  CBC - Abnormal; Notable for the following components:   WBC 3.4 (*)    All other components within normal limits  URINALYSIS, ROUTINE W REFLEX MICROSCOPIC - Abnormal; Notable for the following components:   APPearance HAZY (*)    Hgb urine dipstick SMALL (*)    Leukocytes,Ua TRACE (*)    Bacteria, UA FEW (*)    All other components within normal limits  LIPASE, BLOOD  COMPREHENSIVE METABOLIC PANEL  I-STAT BETA HCG BLOOD, ED ([facility], [facility], [facility] ONLY)  GC/CHLAMYDIA PROBE AMP ([place]) NOT AT [facility]    EKG None  Radiology US Transvaginal Non-ob  Result Date: 05/18/2019 CLINICAL DATA:  Left adnexal pain.  Left ovarian cyst. EXAM: TRANSABDOMINAL AND TRANSVAGINAL ULTRASOUND OF PELVIS DOPPLER ULTRASOUND OF OVARIES TECHNIQUE: Both transabdominal and transvaginal ultrasound examinations of the pelvis were performed. Transabdominal technique was performed for global imaging of the pelvis including uterus, ovaries, adnexal regions, and pelvic cul-de-sac. It was necessary to proceed with endovaginal exam following the transabdominal exam to visualize the uterus, ovaries, adnexa. Color and duplex Doppler ultrasound was utilized to evaluate blood flow to the ovaries. COMPARISON:  Ultrasound 04/04/2017. FINDINGS: Uterus Measurements: 10.7 x 6.7 x 6.7 cm = volume: 249.7 ML. No fibroids or other mass visualized. Endometrium Thickness: 6.6.   No focal abnormality visualized. Right ovary Measurements: 3.8 x 1.9 x 2.1 cm = volume: 7.7 mL. Normal appearance/no adnexal mass. Left ovary Measurements: 3.5 x 3.4 x 7.8 cm = volume: 48.8 mL. 16.0 x 13.1 x 7.2 cm cyst noted on the left ovary. Although the cyst appears to be simple has increased in size from prior exam and cystic ovarian malignancy cannot be completely excluded. Pulsed Doppler evaluation of both ovaries demonstrates normal low-resistance arterial and venous waveforms. Other findings Trace free pelvic fluid. IMPRESSION: 1. Interval growth of large left ovarian cyst with a cyst now measuring 6.0 x 13.1 x 7.2 cm. Although the cysts appears to be simple, given interval growth cystic ovarian malignancy cannot be completely excluded. Gynecologic consultation is suggested. 2.  No evidence of ovarian torsion.  Trace free pelvic fluid. Electronically Signed   By: Marcello Moores  Register   On: 05/18/2019 13:15   US Pelvis Complete  Result Date: 05/18/2019 CLINICAL DATA:  Left adnexal pain.  Left ovarian cyst. EXAM: TRANSABDOMINAL AND TRANSVAGINAL ULTRASOUND OF PELVIS DOPPLER ULTRASOUND OF OVARIES TECHNIQUE: Both transabdominal and transvaginal ultrasound examinations of the pelvis were performed. Transabdominal technique was performed for global imaging of the pelvis including uterus, ovaries, adnexal regions, and pelvic cul-de-sac. It was necessary to proceed with endovaginal exam following the transabdominal exam to visualize the uterus, ovaries, adnexa. Color and duplex Doppler ultrasound was utilized to evaluate blood flow to the ovaries. COMPARISON:  Ultrasound 04/04/2017. FINDINGS: Uterus Measurements: 10.7 x 6.7 x 6.7 cm = volume: 249.7 ML. No fibroids or other mass visualized. Endometrium Thickness: 6.6.  No focal abnormality visualized. Right ovary Measurements: 3.8 x 1.9 x 2.1 cm = volume: 7.7 mL. Normal appearance/no adnexal mass. Left ovary Measurements: 3.5 x 3.4 x 7.8 cm = volume: 48.8 mL. 16.0 x  13.1 x 7.2 cm cyst noted on the left ovary. Although the cyst appears to be simple has increased in size from prior exam and cystic ovarian malignancy cannot be completely excluded. Pulsed Doppler evaluation of both ovaries demonstrates normal low-resistance arterial and  venous waveforms. Other findings Trace free pelvic fluid. IMPRESSION: 1. Interval growth of large left ovarian cyst with a cyst now measuring 6.0 x 13.1 x 7.2 cm. Although the cysts appears to be simple, given interval growth cystic ovarian malignancy cannot be completely excluded. Gynecologic consultation is suggested. 2.  No evidence of ovarian torsion.  Trace free pelvic fluid. Electronically Signed   By: Maisie Fus  Register   On: 05/18/2019 13:15   [place] Art/ven Flow Abd Pelv Doppler  Result Date: 05/18/2019 CLINICAL DATA:  Left adnexal pain.  Left ovarian cyst. EXAM: TRANSABDOMINAL AND TRANSVAGINAL ULTRASOUND OF PELVIS DOPPLER ULTRASOUND OF OVARIES TECHNIQUE: Both transabdominal and transvaginal ultrasound examinations of the pelvis were performed. Transabdominal technique was performed for global imaging of the pelvis including uterus, ovaries, adnexal regions, and pelvic cul-de-sac. It was necessary to proceed with endovaginal exam following the transabdominal exam to visualize the uterus, ovaries, adnexa. Color and duplex Doppler ultrasound was utilized to evaluate blood flow to the ovaries. COMPARISON:  Ultrasound 04/04/2017. FINDINGS: Uterus Measurements: 10.7 x 6.7 x 6.7 cm = volume: 249.7 ML. No fibroids or other mass visualized. Endometrium Thickness: 6.6.  No focal abnormality visualized. Right ovary Measurements: 3.8 x 1.9 x 2.1 cm = volume: 7.7 mL. Normal appearance/no adnexal mass. Left ovary Measurements: 3.5 x 3.4 x 7.8 cm = volume: 48.8 mL. 16.0 x 13.1 x 7.2 cm cyst noted on the left ovary. Although the cyst appears to be simple has increased in size from prior exam and cystic ovarian malignancy cannot be completely excluded.  Pulsed Doppler evaluation of both ovaries demonstrates normal low-resistance arterial and venous waveforms. Other findings Trace free pelvic fluid. IMPRESSION: 1. Interval growth of large left ovarian cyst with a cyst now measuring 6.0 x 13.1 x 7.2 cm. Although the cysts appears to be simple, given interval growth cystic ovarian malignancy cannot be completely excluded. Gynecologic consultation is suggested. 2.  No evidence of ovarian torsion.  Trace free pelvic fluid. Electronically Signed   By: Maisie Fus  Register   On: 05/18/2019 13:15    Procedures Procedures (including critical care time)  Medications Ordered in ED Medications  sodium chloride flush (NS) 0.9 % injection 3 mL (3 mLs Intravenous Not Given 05/18/19 1206)  ketorolac (TORADOL) 30 MG/ML injection 30 mg (30 mg Intramuscular Given 05/18/19 1042)  ondansetron (ZOFRAN-ODT) disintegrating tablet 4 mg (4 mg Oral Given 05/18/19 1042)  morphine 4 MG/ML injection 4 mg (4 mg Intramuscular Given 05/18/19 1214)     Initial Impression / Assessment and Plan / ED Course  I have reviewed the triage vital signs and the nursing notes.  Pertinent labs & imaging results that were available during my care of the patient were reviewed by me and considered in my medical decision making (see chart for details).     Final Clinical Impressions(s) / ED Diagnoses   Final diagnoses:  Left ovarian cyst  Bacterial vaginosis   28 year old female presenting for evaluation of left lower quadrant abdominal pain onset 24 hours ago.  Associated with one episode of vomiting.  Initially radiated to the left flank however not currently radiating.  On exam left lower quadrant tenderness present as well as CVA tenderness.  Pelvic exam with thin white discharge in the vaginal vault and with left adnexal tenderness.  CBC with mild leukopenia which appears consistent with prior.  No anemia CMP nonacute Lipase nonacute Beta-hCG negative UA with small hematuria, trace  leukocytes, 0-5 RBCs, 6-10 WBCs, few bacteria and 6-10 squamous epithelial cells.  Suspect contamination.  Seems less likely related to urinary tract infection. Wet prep with clue cells and WBCs consistent with BV.  Treat with Flagyl.  Pelvic ultrasound revealed no evidence of ovarian torsion however did show interval growth of large left ovarian cyst now measuring 6 x 13 x 7 cm.  Cyst appears simple however given interval growth malignancy cannot be excluded.  I discussed the results with the patient and her significant other at bedside and that this is likely the etiology of her pain. advised on the importance of following up with OB/GYN for further evaluation and to rule out malignancy.  In the meantime I will give her Rx for pain medication and nausea medication.  I will also treat her BV with Flagyl.  Advised on specific return precautions.  She voices understanding the plan and reasons to return.  All questions answered.  Patient stable discharge.  ED Discharge Orders         Ordered    metroNIDAZOLE (FLAGYL) 500 MG tablet  2 times daily     05/18/19 1400    HYDROcodone-acetaminophen (NORCO/VICODIN) 5-325 MG tablet  Every 4 hours PRN,   Status:  Discontinued     05/18/19 1400    ondansetron (ZOFRAN) 4 MG tablet  Every 8 hours PRN     05/18/19 1400    HYDROcodone-acetaminophen (NORCO/VICODIN) 5-325 MG tablet  Every 6 hours PRN     05/18/19 1428           Gazelle Towe S, PA-C 05/18/19 1519    Jacalyn LefevreHaviland, Julie, MD 05/18/19 1520

## 2019-05-18 NOTE — ED Triage Notes (Signed)
Pt states she woke up at 1500 yesterday with pain in LLQ. Pt states hx of kidney issues. Pt describes burning with urination. Relief with heating pad.

## 2019-05-19 LAB — CERVICOVAGINAL ANCILLARY ONLY
Chlamydia: NEGATIVE
Neisseria Gonorrhea: NEGATIVE

## 2019-05-21 ENCOUNTER — Other Ambulatory Visit: Payer: Self-pay | Admitting: Obstetrics and Gynecology

## 2019-05-21 ENCOUNTER — Encounter (HOSPITAL_COMMUNITY): Payer: Self-pay

## 2019-05-21 ENCOUNTER — Encounter (HOSPITAL_COMMUNITY)
Admission: RE | Disposition: A | Discharge status: Home / Self Care | Payer: Self-pay | Source: Ambulatory Visit | Attending: Obstetrics and Gynecology

## 2019-05-21 ENCOUNTER — Encounter: Payer: Self-pay | Admitting: Obstetrics and Gynecology

## 2019-05-21 ENCOUNTER — Encounter: Payer: Self-pay | Admitting: Obstetrics & Gynecology

## 2019-05-21 ENCOUNTER — Other Ambulatory Visit (HOSPITAL_COMMUNITY)
Admission: RE | Admit: 2019-05-21 | Discharge: 2019-05-21 | Disposition: A | Discharge status: Home / Self Care | Payer: Medicaid Other | Source: Ambulatory Visit | Attending: Obstetrics & Gynecology | Admitting: Obstetrics & Gynecology

## 2019-05-21 ENCOUNTER — Other Ambulatory Visit (HOSPITAL_COMMUNITY)
Admission: RE | Admit: 2019-05-21 | Discharge: 2019-05-21 | Disposition: A | Discharge status: Home / Self Care | Payer: Medicaid Other | Source: Ambulatory Visit | Attending: Obstetrics and Gynecology | Admitting: Obstetrics and Gynecology

## 2019-05-21 ENCOUNTER — Ambulatory Visit (HOSPITAL_COMMUNITY)
Admission: RE | Admit: 2019-05-21 | Discharge: 2019-05-21 | Disposition: A | Discharge status: Home / Self Care | Payer: Medicaid Other | Source: Ambulatory Visit | Attending: Obstetrics and Gynecology | Admitting: Obstetrics and Gynecology

## 2019-05-21 ENCOUNTER — Other Ambulatory Visit: Payer: Self-pay

## 2019-05-21 ENCOUNTER — Ambulatory Visit (HOSPITAL_COMMUNITY): Payer: Medicaid Other | Admitting: Anesthesiology

## 2019-05-21 ENCOUNTER — Ambulatory Visit (INDEPENDENT_AMBULATORY_CARE_PROVIDER_SITE_OTHER): Payer: Self-pay | Admitting: Obstetrics & Gynecology

## 2019-05-21 VITALS — BP 127/83 | HR 92 | Wt 184.0 lb

## 2019-05-21 DIAGNOSIS — Z23 Encounter for immunization: Secondary | ICD-10-CM

## 2019-05-21 DIAGNOSIS — N83202 Unspecified ovarian cyst, left side: Secondary | ICD-10-CM | POA: Insufficient documentation

## 2019-05-21 DIAGNOSIS — Q872 Congenital malformation syndromes predominantly involving limbs: Secondary | ICD-10-CM | POA: Insufficient documentation

## 2019-05-21 DIAGNOSIS — Z0000 Encounter for general adult medical examination without abnormal findings: Secondary | ICD-10-CM | POA: Diagnosis not present

## 2019-05-21 DIAGNOSIS — Z79899 Other long term (current) drug therapy: Secondary | ICD-10-CM | POA: Insufficient documentation

## 2019-05-21 DIAGNOSIS — G4730 Sleep apnea, unspecified: Secondary | ICD-10-CM | POA: Insufficient documentation

## 2019-05-21 DIAGNOSIS — N83209 Unspecified ovarian cyst, unspecified side: Secondary | ICD-10-CM

## 2019-05-21 DIAGNOSIS — Z20828 Contact with and (suspected) exposure to other viral communicable diseases: Secondary | ICD-10-CM | POA: Insufficient documentation

## 2019-05-21 DIAGNOSIS — F419 Anxiety disorder, unspecified: Secondary | ICD-10-CM | POA: Insufficient documentation

## 2019-05-21 HISTORY — DX: Personal history of urinary calculi: Z87.442

## 2019-05-21 HISTORY — PX: LAPAROSCOPY: SHX197

## 2019-05-21 LAB — BASIC METABOLIC PANEL
Anion gap: 9 (ref 5–15)
BUN: 12 mg/dL (ref 6–20)
CO2: 22 mmol/L (ref 22–32)
Calcium: 8.5 mg/dL — ABNORMAL LOW (ref 8.9–10.3)
Chloride: 107 mmol/L (ref 98–111)
Creatinine, Ser: 0.89 mg/dL (ref 0.44–1.00)
GFR calc Af Amer: >60 mL/min (ref >60)
GFR calc non Af Amer: >60 mL/min (ref >60)
Glucose, Bld: 77 mg/dL (ref 70–99)
Potassium: 4.2 mmol/L (ref 3.5–5.1)
Sodium: 138 mmol/L (ref 135–145)

## 2019-05-21 LAB — CBC
HCT: 37.1 % (ref 36.0–46.0)
Hemoglobin: 11.8 g/dL — ABNORMAL LOW (ref 12.0–15.0)
MCH: 31.2 pg (ref 26.0–34.0)
MCHC: 31.8 g/dL (ref 30.0–36.0)
MCV: 98.1 fL (ref 80.0–100.0)
Platelets: 170 10*3/uL (ref 150–400)
RBC: 3.78 MIL/uL — ABNORMAL LOW (ref 3.87–5.11)
RDW: 13.3 % (ref 11.5–15.5)
WBC: 6 10*3/uL (ref 4.0–10.5)
nRBC: 0 % (ref 0.0–0.2)

## 2019-05-21 LAB — POCT PREGNANCY, URINE: Preg Test, Ur: NEGATIVE

## 2019-05-21 LAB — SARS CORONAVIRUS 2 BY RT PCR (HOSPITAL ORDER, PERFORMED IN ~~LOC~~ HOSPITAL LAB): SARS Coronavirus 2: NEGATIVE

## 2019-05-21 SURGERY — LAPAROSCOPY, DIAGNOSTIC
Anesthesia: General | Site: Abdomen

## 2019-05-21 MED ORDER — SILVER NITRATE-POT NITRATE 75-25 % EX MISC
CUTANEOUS | Status: AC
  Filled 2019-05-21: qty 1

## 2019-05-21 MED ORDER — ONDANSETRON HCL 4 MG/2ML IJ SOLN
4.0000 mg | Freq: Once | INTRAMUSCULAR | Status: AC
  Administered 2019-05-21: 12:00:00 4 mg via INTRAVENOUS

## 2019-05-21 MED ORDER — HYDROMORPHONE HCL 1 MG/ML IJ SOLN
INTRAMUSCULAR | Status: AC
  Filled 2019-05-21: qty 0.5

## 2019-05-21 MED ORDER — PROPOFOL 10 MG/ML IV BOLUS
INTRAVENOUS | Status: AC
  Filled 2019-05-21: qty 20

## 2019-05-21 MED ORDER — FENTANYL CITRATE (PF) 250 MCG/5ML IJ SOLN
INTRAMUSCULAR | Status: AC
  Filled 2019-05-21: qty 5

## 2019-05-21 MED ORDER — FENTANYL CITRATE (PF) 100 MCG/2ML IJ SOLN
INTRAMUSCULAR | Status: DC | PRN
  Administered 2019-05-21: 100 ug via INTRAVENOUS
  Administered 2019-05-21 (×2): 50 ug via INTRAVENOUS
  Administered 2019-05-21 (×2): 25 ug via INTRAVENOUS

## 2019-05-21 MED ORDER — LIDOCAINE 2% (20 MG/ML) 5 ML SYRINGE
INTRAMUSCULAR | Status: AC
  Filled 2019-05-21: qty 5

## 2019-05-21 MED ORDER — ONDANSETRON HCL 4 MG/2ML IJ SOLN
INTRAMUSCULAR | Status: DC | PRN
  Administered 2019-05-21: 4 mg via INTRAVENOUS

## 2019-05-21 MED ORDER — EPHEDRINE SULFATE-NACL 50-0.9 MG/10ML-% IV SOSY
PREFILLED_SYRINGE | INTRAVENOUS | Status: DC | PRN
  Administered 2019-05-21 (×2): 10 mg via INTRAVENOUS

## 2019-05-21 MED ORDER — ONDANSETRON HCL 4 MG/2ML IJ SOLN
INTRAMUSCULAR | Status: AC
  Administered 2019-05-21: 12:00:00 4 mg via INTRAVENOUS
  Filled 2019-05-21: qty 2

## 2019-05-21 MED ORDER — ROCURONIUM BROMIDE 10 MG/ML (PF) SYRINGE
PREFILLED_SYRINGE | INTRAVENOUS | Status: AC
  Filled 2019-05-21: qty 10

## 2019-05-21 MED ORDER — DEXAMETHASONE SODIUM PHOSPHATE 10 MG/ML IJ SOLN
INTRAMUSCULAR | Status: DC | PRN
  Administered 2019-05-21: 5 mg via INTRAVENOUS

## 2019-05-21 MED ORDER — PROMETHAZINE HCL 25 MG/ML IJ SOLN
6.2500 mg | INTRAMUSCULAR | Status: DC | PRN
  Administered 2019-05-21: 6.25 mg via INTRAVENOUS

## 2019-05-21 MED ORDER — FERRIC SUBSULFATE 259 MG/GM EX SOLN
CUTANEOUS | Status: AC
  Filled 2019-05-21: qty 8

## 2019-05-21 MED ORDER — PROPOFOL 10 MG/ML IV BOLUS
INTRAVENOUS | Status: DC | PRN
  Administered 2019-05-21: 200 mg via INTRAVENOUS

## 2019-05-21 MED ORDER — SUGAMMADEX SODIUM 200 MG/2ML IV SOLN
INTRAVENOUS | Status: DC | PRN
  Administered 2019-05-21: 175 mg via INTRAVENOUS

## 2019-05-21 MED ORDER — HYDROMORPHONE HCL 1 MG/ML IJ SOLN
INTRAMUSCULAR | Status: DC | PRN
  Administered 2019-05-21: 0.5 mg via INTRAVENOUS

## 2019-05-21 MED ORDER — ROCURONIUM BROMIDE 50 MG/5ML IV SOSY
PREFILLED_SYRINGE | INTRAVENOUS | Status: DC | PRN
  Administered 2019-05-21: 20 mg via INTRAVENOUS
  Administered 2019-05-21: 60 mg via INTRAVENOUS

## 2019-05-21 MED ORDER — FENTANYL CITRATE (PF) 100 MCG/2ML IJ SOLN
25.0000 ug | INTRAMUSCULAR | Status: DC | PRN
  Administered 2019-05-21 (×2): 50 ug via INTRAVENOUS

## 2019-05-21 MED ORDER — OXYCODONE HCL 5 MG PO TABS: 5.0000 mg | ORAL_TABLET | Freq: Once | ORAL | Status: DC | PRN

## 2019-05-21 MED ORDER — BUPIVACAINE HCL (PF) 0.5 % IJ SOLN
INTRAMUSCULAR | Status: AC
  Filled 2019-05-21: qty 30

## 2019-05-21 MED ORDER — LACTATED RINGERS IV SOLN
INTRAVENOUS | Status: DC
  Administered 2019-05-21 (×2): via INTRAVENOUS

## 2019-05-21 MED ORDER — DEXAMETHASONE SODIUM PHOSPHATE 10 MG/ML IJ SOLN
INTRAMUSCULAR | Status: AC
  Filled 2019-05-21: qty 1

## 2019-05-21 MED ORDER — HEMOSTATIC AGENTS (NO CHARGE) OPTIME
TOPICAL | Status: DC | PRN
  Administered 2019-05-21: 1 via TOPICAL

## 2019-05-21 MED ORDER — HYDROCODONE-ACETAMINOPHEN 5-325 MG PO TABS: 1.0000 | ORAL_TABLET | Freq: Four times a day (QID) | ORAL | 0 refills | Status: DC | PRN

## 2019-05-21 MED ORDER — SOD CITRATE-CITRIC ACID 500-334 MG/5ML PO SOLN
30.0000 mL | ORAL | Status: AC
  Administered 2019-05-21: 12:00:00 30 mL via ORAL
  Filled 2019-05-21: qty 30

## 2019-05-21 MED ORDER — ONDANSETRON HCL 4 MG/2ML IJ SOLN
INTRAMUSCULAR | Status: AC
  Filled 2019-05-21: qty 2

## 2019-05-21 MED ORDER — OXYCODONE HCL 5 MG/5ML PO SOLN: 5.0000 mg | Freq: Once | ORAL | Status: DC | PRN

## 2019-05-21 MED ORDER — BUPIVACAINE HCL 0.5 % IJ SOLN
INTRAMUSCULAR | Status: DC | PRN
  Administered 2019-05-21: 10 mL
  Administered 2019-05-21: 5 mL

## 2019-05-21 MED ORDER — MIDAZOLAM HCL 2 MG/2ML IJ SOLN
INTRAMUSCULAR | Status: AC
  Filled 2019-05-21: qty 2

## 2019-05-21 MED ORDER — LIDOCAINE 2% (20 MG/ML) 5 ML SYRINGE
INTRAMUSCULAR | Status: DC | PRN
  Administered 2019-05-21: 60 mg via INTRAVENOUS

## 2019-05-21 MED ORDER — FENTANYL CITRATE (PF) 100 MCG/2ML IJ SOLN
INTRAMUSCULAR | Status: AC
  Filled 2019-05-21: qty 2

## 2019-05-21 MED ORDER — ACETAMINOPHEN 500 MG PO TABS
1000.0000 mg | ORAL_TABLET | ORAL | Status: DC
  Filled 2019-05-21: qty 2

## 2019-05-21 MED ORDER — PROMETHAZINE HCL 25 MG/ML IJ SOLN
INTRAMUSCULAR | Status: AC
  Filled 2019-05-21: qty 1

## 2019-05-21 MED ORDER — SODIUM CHLORIDE 0.9 % IR SOLN
Status: DC | PRN
  Administered 2019-05-21: 1000 mL

## 2019-05-21 MED ORDER — GABAPENTIN 300 MG PO CAPS
300.0000 mg | ORAL_CAPSULE | ORAL | Status: AC
  Administered 2019-05-21: 300 mg via ORAL
  Filled 2019-05-21: qty 1

## 2019-05-21 MED ORDER — MIDAZOLAM HCL 5 MG/5ML IJ SOLN
INTRAMUSCULAR | Status: DC | PRN
  Administered 2019-05-21: 2 mg via INTRAVENOUS

## 2019-05-21 SURGICAL SUPPLY — 41 items
APPLICATOR ARISTA FLEXITIP XL (MISCELLANEOUS) ×1 IMPLANT
COVER WAND RF STERILE (DRAPES) ×2 IMPLANT
DERMABOND ADVANCED (GAUZE/BANDAGES/DRESSINGS) ×1
DERMABOND ADVANCED .7 DNX12 (GAUZE/BANDAGES/DRESSINGS) ×1 IMPLANT
DEVICE TROCAR PUNCTURE CLOSURE (ENDOMECHANICALS) ×1 IMPLANT
DRSG OPSITE POSTOP 3X4 (GAUZE/BANDAGES/DRESSINGS) ×1 IMPLANT
DURAPREP 26ML APPLICATOR (WOUND CARE) ×2 IMPLANT
GLOVE BIO SURGEON STRL SZ7 (GLOVE) ×1 IMPLANT
GLOVE BIO SURGEON STRL SZ7.5 (GLOVE) ×3 IMPLANT
GLOVE BIOGEL PI IND STRL 7.0 (GLOVE) ×2 IMPLANT
GLOVE BIOGEL PI IND STRL 8 (GLOVE) ×2 IMPLANT
GLOVE BIOGEL PI INDICATOR 7.0 (GLOVE) ×3
GLOVE BIOGEL PI INDICATOR 8 (GLOVE) ×2
GLOVE NEODERM STER SZ 7 (GLOVE) ×2 IMPLANT
GOWN STRL REUS W/ TWL LRG LVL3 (GOWN DISPOSABLE) ×1 IMPLANT
GOWN STRL REUS W/ TWL XL LVL3 (GOWN DISPOSABLE) ×1 IMPLANT
GOWN STRL REUS W/TWL LRG LVL3 (GOWN DISPOSABLE) ×1
GOWN STRL REUS W/TWL XL LVL3 (GOWN DISPOSABLE) ×1
HEMOSTAT ARISTA ABSORB 3G PWDR (HEMOSTASIS) ×1 IMPLANT
KIT TURNOVER KIT B (KITS) ×2 IMPLANT
NDL INSUFF ACCESS 14 VERSASTEP (NEEDLE) IMPLANT
NS IRRIG 1000ML POUR BTL (IV SOLUTION) ×2 IMPLANT
PACK LAPAROSCOPY BASIN (CUSTOM PROCEDURE TRAY) ×2 IMPLANT
PACK TRENDGUARD 450 HYBRID PRO (MISCELLANEOUS) IMPLANT
POUCH SPECIMEN RETRIEVAL 10MM (ENDOMECHANICALS) ×1 IMPLANT
PROTECTOR NERVE ULNAR (MISCELLANEOUS) ×4 IMPLANT
SCISSORS LAP 5X35 DISP (ENDOMECHANICALS) ×1 IMPLANT
SET IRRIG TUBING LAPAROSCOPIC (IRRIGATION / IRRIGATOR) ×1 IMPLANT
SET TUBE SMOKE EVAC HIGH FLOW (TUBING) ×2 IMPLANT
SHEARS HARMONIC ACE PLUS 36CM (ENDOMECHANICALS) IMPLANT
SLEEVE ENDOPATH XCEL 5M (ENDOMECHANICALS) ×2 IMPLANT
SUT VICRYL 0 UR6 27IN ABS (SUTURE) ×3 IMPLANT
SUT VICRYL 4-0 PS2 18IN ABS (SUTURE) ×2 IMPLANT
TOWEL GREEN STERILE FF (TOWEL DISPOSABLE) ×4 IMPLANT
TRAY FOLEY W/BAG SLVR 14FR (SET/KITS/TRAYS/PACK) ×2 IMPLANT
TRENDGUARD 450 HYBRID PRO PACK (MISCELLANEOUS) ×2
TROCAR BALLN 12MMX100 BLUNT (TROCAR) ×2 IMPLANT
TROCAR BLADELESS 11MM (ENDOMECHANICALS) ×1 IMPLANT
TROCAR VERSASTEP PLUS 12MM (TROCAR) IMPLANT
TROCAR XCEL NON-BLD 5MMX100MML (ENDOMECHANICALS) ×2 IMPLANT
WARMER LAPAROSCOPE (MISCELLANEOUS) ×2 IMPLANT

## 2019-05-21 NOTE — Anesthesia Procedure Notes (Signed)

## 2019-05-21 NOTE — Anesthesia Preprocedure Evaluation (Addendum)
Anesthesia Evaluation  Patient identified by MRN, date of birth, ID band Patient awake    Reviewed: Allergy & Precautions, NPO status , Patient's Chart, lab work & pertinent test results  History of Anesthesia Complications Negative for: history of anesthetic complications  Airway Mallampati: II  TM Distance: >3 FB Neck ROM: Full    Dental  (+) Dental Advisory Given   Pulmonary sleep apnea ,    Pulmonary exam normal        Cardiovascular negative cardio ROS Normal cardiovascular exam     Neuro/Psych PSYCHIATRIC DISORDERS Anxiety negative neurological ROS     GI/Hepatic negative GI ROS, Neg liver ROS,   Endo/Other   Obesity   Renal/GU negative Renal ROS     Musculoskeletal negative musculoskeletal ROS (+)  Klippel-Trenaunay-Weber syndrome    Abdominal   Peds  Hematology negative hematology ROS (+)   Anesthesia Other Findings   Reproductive/Obstetrics                            Anesthesia Physical Anesthesia Plan  ASA: II  Anesthesia Plan: General   Post-op Pain Management:    Induction: Intravenous  PONV Risk Score and Plan: 4 or greater and Treatment may vary due to age or medical condition, Ondansetron, Dexamethasone and Midazolam  Airway Management Planned: Oral ETT  Additional Equipment: None  Intra-op Plan:   Post-operative Plan: Extubation in OR  Informed Consent: I have reviewed the patients History and Physical, chart, labs and discussed the procedure including the risks, benefits and alternatives for the proposed anesthesia with the patient or authorized representative who has indicated his/her understanding and acceptance.     Dental advisory given  Plan Discussed with: CRNA and Anesthesiologist  Anesthesia Plan Comments:        Anesthesia Quick Evaluation

## 2019-05-21 NOTE — Transfer of Care (Signed)
Immediate Anesthesia Transfer of Care Note  Patient: Frances Reed  Procedure(s) Performed: LAPAROSCOPY DIAGNOSTIC, Left Pelvic Cystectomy  (N/A Abdomen)  Patient Location: PACU  Anesthesia Type:General  Level of Consciousness: awake, alert , oriented and patient cooperative  Airway & Oxygen Therapy: Patient Spontanous Breathing and Patient connected to nasal cannula oxygen  Post-op Assessment: Report given to RN, Post -op Vital signs reviewed and stable and Patient moving all extremities X 4  Post vital signs: Reviewed and stable  Last Vitals:  Vitals Value Taken Time  BP 146/98 05/21/19 1506  Temp    Pulse 116 05/21/19 1509  Resp 17 05/21/19 1509  SpO2 100 % 05/21/19 1509  Vitals shown include unvalidated device data.  Last Pain:  Vitals:   05/21/19 1132  TempSrc:   PainSc: 10-Worst pain ever      Patients Stated Pain Goal: 4 (28/20/60 1561)  Complications: No apparent anesthesia complications

## 2019-05-21 NOTE — Progress Notes (Signed)
   Subjective:    Patient ID: Frances Reed, female    DOB: 10/31/1990, 28 y.o.   MRN: [id number]  HPI 28 yo engaged P84 (1 yo son) here today as a follow up after being seen in the [facility] ED 3 days ago. She went in with 7 out of 10 pelvic pain and a 13 cm left ovarian simple cyst was seen. She had a similar pain in 2018 which resolved and at that time the cyst was about 10 cm. She rates her pain today as a 10 out of 10, says she cannot go to work or even function with this pain.   Review of Systems No recent pap   She in a same sex relationship, monogamous for 5 years. Objective:   Physical Exam        Assessment & Plan:  Preventative care- pap and flu vaccine Severe pelvic pain and 13 cm ovarian cyst- I spoke with Dr. Rip Harbour about this case and he is going to contact the main OR to determine when she can have an ovarian cystectomy.  She says that she cannot proceed with this pain.

## 2019-05-21 NOTE — Discharge Instructions (Signed)

## 2019-05-21 NOTE — Progress Notes (Signed)
U/S done in ED  Still in pain 9/10

## 2019-05-21 NOTE — H&P (Signed)
Frances Reed is an 28 y.o. female G1P1 with LMP 05/10/2019 with known left ovarian cyst since 03/2017. Started having abd/pelvic pain on Wednesday was seen in ER. U/S demonstrated large simple appearing ovarian cyst without evidence of torsion.  Was discharged home on pain medications with instructions to follow up in GYN clinic.  Seen in clinic today by Dr. Idolina Primer. Pt reported pain since she ran out of pain medication. No N/V, fever or chills. Denies any bowel or bladder dysfunction. Concerned about torsion and she was sent for operative management.   LMP 05/10/2019  TSVD x 1  Oral surgery, cosmetic surgery, ? Appendectomy as a child  Klippel-Trenaunay-Weber syndrome  Sexual active, same sex relationship Pap collected today in office  Menstrual History: Menarche age: 75 Patient's last menstrual period was 05/10/2019.    Past Medical History:  Diagnosis Date  . Generalized anxiety disorder 06/13/2015  . History of kidney stones   . Klippel-Trenaunay-Weber syndrome   . Klippel-Trenaunay-Weber syndrome   . Sleep apnea     Past Surgical History:  Procedure Laterality Date  . FOOT SURGERY Bilateral   . SKIN GRAFT Right 2000   abdominal graft for foot  . THROAT SURGERY    . TONSILLECTOMY      History reviewed. No pertinent family history.  Social History:  reports that she has never smoked. Her smokeless tobacco use includes chew. She reports current alcohol use of about 3.0 standard drinks of alcohol per week. She reports that she does not use drugs.  Allergies:  Allergies  Allergen Reactions  . Coconut Oil Anaphylaxis  . Motrin [Ibuprofen] Other (See Comments)    Klippel Trenauney Syndrome "Doesn't like to take it"    Medications Prior to Admission  Medication Sig Dispense Refill Last Dose  . acetaminophen (TYLENOL) 500 MG tablet Take 1,000 mg by mouth every 6 (six) hours as needed for mild pain.   05/21/2019 at 0730  . clonazePAM (KLONOPIN) 0.5 MG tablet Take 0.5  tablets (0.25 mg total) by mouth 2 (two) times daily as needed for anxiety. (Patient taking differently: Take 0.5 mg by mouth daily as needed for anxiety. ) 60 tablet 2 05/21/2019 at 0730  . gabapentin (NEURONTIN) 100 MG capsule TAKE 1 CAPSULE BY MOUTH TWICE A DAY 180 capsule 0 05/20/2019 at Unknown time  . gabapentin (NEURONTIN) 100 MG capsule Take 200 mg by mouth daily.   05/20/2019 at Unknown time  . metroNIDAZOLE (FLAGYL) 500 MG tablet Take 1 tablet (500 mg total) by mouth 2 (two) times daily for 7 days. 14 tablet 0 05/21/2019 at 0730  . ondansetron (ZOFRAN ODT) 4 MG disintegrating tablet Take 1 tablet (4 mg total) by mouth every 8 (eight) hours as needed for nausea or vomiting. 10 tablet 0 05/21/2019 at 0730  . ondansetron (ZOFRAN) 4 MG tablet Take 1 tablet (4 mg total) by mouth every 8 (eight) hours as needed for nausea or vomiting. 9 tablet 0 05/21/2019 at Unknown time  . PARoxetine (PAXIL) 10 MG tablet Take 10 mg by mouth daily.   05/20/2019 at Unknown time  . HYDROcodone-acetaminophen (NORCO/VICODIN) 5-325 MG tablet Take 1 tablet by mouth every 6 (six) hours as needed. (Patient not taking: Reported on 05/21/2019) 6 tablet 0   . PARoxetine (PAXIL) 20 MG tablet Take 1 tablet (20 mg total) by mouth daily. (Patient not taking: Reported on 05/21/2019) 90 tablet 0     Review of Systems  Constitutional: Negative.   Respiratory: Negative.   Cardiovascular: Negative.  Gastrointestinal: Positive for abdominal pain.  Genitourinary: Negative.     Blood pressure 133/84, pulse 67, temperature 98 F (36.7 C), temperature source Oral, resp. rate 16, height 5\' 2"  (1.575 m), weight 83.5 kg, last menstrual period 05/10/2019, SpO2 99 %, unknown if currently breastfeeding. Physical Exam  Constitutional: She appears well-developed and well-nourished.  Cardiovascular: Normal rate and regular rhythm.  Respiratory: Effort normal and breath sounds normal.  GI: Soft. Bowel sounds are normal. There is abdominal  tenderness. There is no rebound and no guarding.  Genitourinary:    Genitourinary Comments: Deferred to OR     Results for orders placed or performed during the hospital encounter of 05/21/19 (from the past 24 hour(s))  Pregnancy, urine POC     Status: None   Collection Time: 05/21/19 11:36 AM  Result Value Ref Range   Preg Test, Ur NEGATIVE NEGATIVE  CBC     Status: Abnormal   Collection Time: 05/21/19 11:40 AM  Result Value Ref Range   WBC 6.0 4.0 - 10.5 K/uL   RBC 3.78 (L) 3.87 - 5.11 MIL/uL   Hemoglobin 11.8 (L) 12.0 - 15.0 g/dL   HCT 05/23/19 10.6 - 26.9 %   MCV 98.1 80.0 - 100.0 fL   MCH 31.2 26.0 - 34.0 pg   MCHC 31.8 30.0 - 36.0 g/dL   RDW 48.5 46.2 - 70.3 %   Platelets 170 150 - 400 K/uL   nRBC 0.0 0.0 - 0.2 %    No results found.  Assessment/Plan: Left ovarian cyst  U/S findings reviewed with pt. Surgery management of ovarian cystectomy vs oophorectomy pending operative findings reviewed with pt. R/B/Post op care reviewed with pt. Pt verbalized understanding and agrees to proceed.   50.0 05/21/2019, 12:35 PM

## 2019-05-21 NOTE — Op Note (Addendum)
Frances Reed PROCEDURE DATE: 05/21/2019  PREOPERATIVE DIAGNOSES: Left Ovarian cyst POSTOPERATIVE DIAGNOSES: Left paraovarian cyst PROCEDURE: Laparoscopic left paraovarian cystectomy SURGEON:  Dr. Arlina Robes, MD ASSISTANT: Dr. Darron Doom An experienced assistant was required given the standard of surgical care given the complexity of the case.  This assistant was needed for exposure, dissection, suctioning, retraction, instrument exchange, and for overall help during the procedure.  INDICATIONS: 28 y.o. G1P1001 with aforementioned preoperative diagnoses here today for definitive surgical management.   Risks of surgery were discussed with the patient including but not limited to: bleeding which may require transfusion or reoperation; infection which may require antibiotics; injury to bowel, bladder, ureters or other surrounding organs; need for additional procedures including laparotomy; thromboembolic phenomenon, incisional problems and other postoperative/anesthesia complications. Written informed consent was obtained.    FINDINGS:  Small uterus, right adnexa with normal ovary and tube. Enlarged left ovary with dilated left tube with large paraovarian cyst approx 15 cm x 15 cm No  No evidence of endometriosis, adhesions or any other abdominal/pelvic abnormality.  Normal upper abdomen.  ANESTHESIA:    General INTRAVENOUS FLUIDS: As recorded ESTIMATED BLOOD LOSS: 25 ml SPECIMENS:  left paraovarian cyst wall COMPLICATIONS: None immediate   PROCEDURE IN DETAIL:  The patient  had sequential compression devices applied to her lower extremities while in the preoperative area.  She was then taken to the operating room where general anesthesia was administered and was found to be adequate.  She was placed in the dorsal lithotomy position, and was prepped and draped in a sterile manner.  A Foley catheter was inserted into her bladder and attached to constant drainage and a uterine manipulator was  then advanced into the uterus .  After an adequate timeout was performed, attention was then turned to the patient's abdomen where a 5-mm skin incision was made in the left upper quadrant. A 5 mm Optiveiw trochar was then easily advanced in to abd. However unable to see into the pelvis due to some adhesions. Elected to proceed with placement of a 5 mm Optiview supraumbilical.    Adequate pneumoperitoneum was obtained.  A survey of the patient's pelvis and abdomen revealed the findings above. A 11 mm Optiview was placed in the left quadrant.  A 5-mm Optiview was placed in the right quadrant. The cyst wall was grasped with grasped and a needle aspirator was placed. Aprroxiamtely 300 cc cc of clear fluids was removed. The cyst wall was regrasped and a small incision was made and the cyst wall was grasped. The cyst wall was then bluntly dissected with counter traction from the surrounding tissue. The cyst wall was then placed in a Endocatch bag and removed through the 11 mm port.  The pelvis was irrigated. Astra was placed over the tissue were the cyst dissection occurred.   lower quadrant ports  were then placed under direct visualization.   Excellent hemostasis was noted.   No intraoperative injury to other surrounding organs was noted.  The abdomen was desufflated and all instruments were then removed from the patient's abdomen. The left quadrant  fascial incision was closed with a 0 Vicryl.   All skin incisions were closed with 3-0 Vicryl subcuticular stitches and Dermabond.   The patient will be discharged to home as per PACU criteria.

## 2019-05-23 NOTE — Anesthesia Postprocedure Evaluation (Signed)
Anesthesia Post Note  Patient: Frances Reed  Procedure(s) Performed: LAPAROSCOPY DIAGNOSTIC, Left Pelvic Cystectomy  (N/A Abdomen)     Patient location during evaluation: PACU Anesthesia Type: General Level of consciousness: awake and alert Pain management: pain level controlled Vital Signs Assessment: post-procedure vital signs reviewed and stable Respiratory status: spontaneous breathing, nonlabored ventilation, respiratory function stable and patient connected to nasal cannula oxygen Cardiovascular status: blood pressure returned to baseline and stable Postop Assessment: no apparent nausea or vomiting Anesthetic complications: no    Last Vitals:  Vitals:   05/21/19 1622 05/21/19 1627  BP: 139/83   Pulse: (!) 102 91  Resp: 16 13  Temp:  (!) 36.1 C  SpO2: 96% 96%    Last Pain:  Vitals:   05/21/19 1615  TempSrc:   PainSc: 10-Worst pain ever                 Ryan P Ellender

## 2019-05-24 ENCOUNTER — Encounter (HOSPITAL_COMMUNITY): Payer: Self-pay | Admitting: Obstetrics and Gynecology

## 2019-05-24 LAB — SURGICAL PATHOLOGY

## 2019-05-26 LAB — CYTOLOGY - PAP

## 2019-05-31 ENCOUNTER — Telehealth: Payer: Self-pay | Admitting: *Deleted

## 2019-05-31 NOTE — Telephone Encounter (Addendum)
-----   Message from Emily Filbert, MD sent at 05/27/2019  1:20 PM EDT ----- Please let her know that her pap showed LGSIL and she will need to have a colposcopy. Thanks  10/5  1025  Called pt and informed her of abnormal pap requiring Colposcopy. The procedure was explained to pt and she was informed that it will be performed on the day of her post op appt 10/29. Pt stated that she needed to know when she can return to work and also will need a note for work in order to return. I advised pt that I will check with Dr. Rip Harbour and let her know. Pt would like to pick up letter from the office once it is ready. She voiced understanding of all information and instructions given and had no questions. In basket message sent to Dr. Rip Harbour requesting recommendations for return to work date. Letter for pt will then be composed.

## 2019-06-01 ENCOUNTER — Other Ambulatory Visit: Payer: Self-pay

## 2019-06-01 DIAGNOSIS — Z20822 Contact with and (suspected) exposure to covid-19: Secondary | ICD-10-CM

## 2019-06-02 ENCOUNTER — Encounter: Payer: Self-pay | Admitting: Obstetrics and Gynecology

## 2019-06-04 LAB — NOVEL CORONAVIRUS, NAA: SARS-CoV-2, NAA: NOT DETECTED

## 2019-06-04 NOTE — Telephone Encounter (Addendum)
Per chart review letter was prepared for 06/02/19. I called Josphine and she informed me she has already picked up a copy of the letter from the office. Linda,RN

## 2019-06-09 ENCOUNTER — Other Ambulatory Visit: Payer: Self-pay

## 2019-06-09 DIAGNOSIS — Z20822 Contact with and (suspected) exposure to covid-19: Secondary | ICD-10-CM

## 2019-06-10 LAB — NOVEL CORONAVIRUS, NAA: SARS-CoV-2, NAA: NOT DETECTED

## 2019-06-15 ENCOUNTER — Ambulatory Visit (INDEPENDENT_AMBULATORY_CARE_PROVIDER_SITE_OTHER): Payer: Self-pay

## 2019-06-15 ENCOUNTER — Inpatient Hospital Stay: Admission: RE | Admit: 2019-06-15 | Payer: Medicaid Other | Source: Ambulatory Visit

## 2019-06-15 ENCOUNTER — Other Ambulatory Visit: Payer: Self-pay

## 2019-06-15 ENCOUNTER — Ambulatory Visit
Admission: EM | Admit: 2019-06-15 | Discharge: 2019-06-15 | Disposition: A | Discharge status: Home / Self Care | Payer: Self-pay | Attending: Emergency Medicine | Admitting: Emergency Medicine

## 2019-06-15 DIAGNOSIS — S6991XA Unspecified injury of right wrist, hand and finger(s), initial encounter: Secondary | ICD-10-CM

## 2019-06-15 DIAGNOSIS — W1781XA Fall down embankment (hill), initial encounter: Secondary | ICD-10-CM

## 2019-06-15 DIAGNOSIS — Y93K1 Activity, walking an animal: Secondary | ICD-10-CM

## 2019-06-15 NOTE — Discharge Instructions (Addendum)
Important to ice every 2-3 hours - Do not exceed 20 minutes per application -to help with swelling, pain. May take Tylenol as needed for additional relief. Go to ER for further intervention if you develop worsening swelling, pain, numbness, discoloration.

## 2019-06-15 NOTE — ED Provider Notes (Signed)
[name]    CSN: [id number] Arrival date & time: 06/15/19  1750      History   Chief Complaint Chief Complaint  Patient presents with  . Hand Pain    HPI Frances Reed is a 28 y.o. female with history of Klippel-Trenaunay Weber syndrome presenting for right hand pain, swelling, bruising after fall when 2 of her 80 pound dogs pulled her down a hill during walk.  Patient denies open wound, dog bite.  No head trauma, LOC.  Patient thinks she had her arm outstretched to catch her fall, though can remember as it "happened so fast ".  Patient is not take anything for pain.  Denies history of easy bleeding/bruising.   Past Medical History:  Diagnosis Date  . Generalized anxiety disorder 06/13/2015  . History of kidney stones   . Klippel-Trenaunay-Weber syndrome   . Klippel-Trenaunay-Weber syndrome   . Sleep apnea     Patient Active Problem List   Diagnosis Date Noted  . Pain of right lower leg 10/18/2015  . Generalized anxiety disorder 06/13/2015  . Carpal tunnel syndrome 01/18/2015  . 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 FOOT SURGERY Bilateral   . LAPAROSCOPY N/A 05/21/2019   Procedure: LAPAROSCOPY DIAGNOSTIC, Left Pelvic Cystectomy ;  Surgeon: Chancy Milroy, MD;  Location: [facility];  Service: Gynecology;  Laterality: N/A;  . SKIN GRAFT Right 2000   abdominal graft for foot  . THROAT SURGERY    . TONSILLECTOMY      OB History    Gravida  1   Para  1   Term  1   Preterm      AB      Living  1     SAB      TAB      Ectopic      Multiple      Live Births  1            Home Medications    Prior to Admission medications   Medication Sig Start Date End Date Taking? Authorizing Provider  acetaminophen (TYLENOL) 500 MG tablet Take 1,000 mg by mouth every 6 (six) hours as needed for mild pain.    [provider]  clonazePAM (KLONOPIN) 0.5 MG tablet Take 0.5  tablets (0.25 mg total) by mouth 2 (two) times daily as needed for anxiety. Patient taking differently: Take 0.5 mg by mouth daily as needed for anxiety.  04/10/17   Roselee Nova, MD  gabapentin (NEURONTIN) 100 MG capsule TAKE 1 CAPSULE BY MOUTH TWICE A DAY 06/24/17   Roselee Nova, MD  PARoxetine (PAXIL) 10 MG tablet Take 10 mg by mouth daily.    [provider]    Family History No family history on file.  Social History Social History   Tobacco Use  . Smoking status: Never Smoker  . Smokeless tobacco: Current User    Types: Chew  Substance Use Topics  . Alcohol use: Yes    Alcohol/week: 3.0 standard drinks    Types: 3 Cans of beer per week    Comment: per week  . Drug use: No     Allergies   Coconut oil and Motrin [ibuprofen]   Review of Systems Review of Systems  Constitutional: Negative for fatigue and fever.  Respiratory: Negative for cough and shortness of breath.   Cardiovascular: Negative for chest pain and palpitations.  Musculoskeletal:  Positive for right hand pain, swelling  Neurological: Negative for weakness and numbness.     Physical Exam Triage Vital Signs ED Triage Vitals [06/15/19 1757]  Enc Vitals Group     BP 135/77     Pulse Rate 86     Resp 20     Temp 98.5 F (36.9 C)     Temp Source Oral     SpO2 97 %     Weight      Height      Head Circumference      Peak Flow      Pain Score 8     Pain Loc      Pain Edu?      Excl. in GC?    No data found.  Updated Vital Signs BP 135/77 (BP Location: Left Arm)   Pulse 86   Temp 98.5 F (36.9 C) (Oral)   Resp 20   LMP 06/08/2019   SpO2 97%   Visual Acuity Right Eye Distance:   Left Eye Distance:   Bilateral Distance:    Right Eye Near:   Left Eye Near:    Bilateral Near:     Physical Exam Constitutional:      General: She is not in acute distress. HENT:     Head: Normocephalic and atraumatic.  Eyes:     General: No scleral icterus.     Conjunctiva/sclera: Conjunctivae normal.     Pupils: Pupils are equal, round, and reactive to light.  Cardiovascular:     Rate and Rhythm: Normal rate.  Pulmonary:     Effort: Pulmonary effort is normal. No respiratory distress.  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  Patient with distal ulnar head tenderness without deformity.  Patient also endorsing diffuse tenderness over second, third digits, MCPs.  Patient has full active ROM of wrist, though endorses pain with full extension.  Neurovascularly intact.  Neurological:     General: No focal deficit present.     Mental Status: She is alert.      UC Treatments / Results  Labs (all labs ordered are listed, but only abnormal results are displayed) Labs Reviewed - No data to display  EKG   Radiology Dg Hand Complete Right  Result Date: 06/15/2019 CLINICAL DATA:  Pain and swelling.  Recent fall EXAM: RIGHT HAND - COMPLETE 3+ VIEW COMPARISON:  None. FINDINGS: Frontal, oblique, and lateral views obtained. There is evidence of old trauma involving the proximal scaphoid with remodeling in this area. There is no acute fracture or dislocation. There is cystic change involving multiple carpal bones. There is narrowing at the lateral radiocarpal joint. Other joint spaces appear unremarkable. No erosive change. IMPRESSION: No apparent acute fracture or dislocation. Evidence of old trauma involving the proximal scaphoid. Multiple cystic areas in the carpal bones, likely of arthropathic etiology. Narrowing of the lateral radiocarpal joint. No erosive changes. Electronically Signed   By: Bretta Bang III M.D.   On: 06/15/2019 18:28    Procedures Procedures (including critical care time)  Medications Ordered in UC Medications - No data to display  Initial Impression / Assessment and Plan / UC Course  I have reviewed the triage vital signs and the nursing notes.  Pertinent labs & imaging results  that were available during my care of the patient were reviewed by me and considered in my medical decision making (see chart for details).      Given mechanism of injury, swelling, tenderness  x-ray obtained in office, reviewed by me and radiology: No obvious acute fracture dislocation.  There is evidence of old trauma of proximal scaphoid, which patient admits to injuring "years ago".  Applied right thumb spica for additional support/compression which patient tolerated well.  Patient will continue conservative management as outlined below.  Given patient's connective tissue disorder, reviewed at length signs/symptoms of compression syndrome, of which patient verbalized understanding.   Final Clinical Impressions(s) / UC Diagnoses   Final diagnoses:  Hand injury, right, initial encounter     Discharge Instructions     Important to ice every 2-3 hours - Do not exceed 20 minutes per application -to help with swelling, pain. May take Tylenol as needed for additional relief. Go to ER for further intervention if you develop worsening swelling, pain, numbness, discoloration.    ED Prescriptions    None     PDMP not reviewed this encounter.   Odette FractionHall-Potvin, [place], [place]-C 06/15/19 1840

## 2019-06-15 NOTE — ED Triage Notes (Signed)
Pt states her dogs pulled her down landing on the ground. C/o rt hand pain, swelling, and bruising.

## 2019-06-18 ENCOUNTER — Other Ambulatory Visit: Payer: Self-pay

## 2019-06-18 DIAGNOSIS — Z20822 Contact with and (suspected) exposure to covid-19: Secondary | ICD-10-CM

## 2019-06-19 LAB — NOVEL CORONAVIRUS, NAA: SARS-CoV-2, NAA: NOT DETECTED

## 2019-06-21 ENCOUNTER — Ambulatory Visit
Admission: EM | Admit: 2019-06-21 | Discharge: 2019-06-21 | Disposition: A | Discharge status: Home / Self Care | Payer: Medicaid Other | Attending: Family Medicine | Admitting: Family Medicine

## 2019-06-21 DIAGNOSIS — S46811A Strain of other muscles, fascia and tendons at shoulder and upper arm level, right arm, initial encounter: Secondary | ICD-10-CM

## 2019-06-21 DIAGNOSIS — S161XXA Strain of muscle, fascia and tendon at neck level, initial encounter: Secondary | ICD-10-CM

## 2019-06-21 MED ORDER — KETOROLAC TROMETHAMINE 60 MG/2ML IM SOLN
30.0000 mg | Freq: Once | INTRAMUSCULAR | Status: AC
  Administered 2019-06-21: 30 mg via INTRAMUSCULAR

## 2019-06-21 MED ORDER — PREDNISONE 50 MG PO TABS: 50.0000 mg | ORAL_TABLET | Freq: Every day | ORAL | 0 refills | Status: DC

## 2019-06-21 MED ORDER — CYCLOBENZAPRINE HCL 5 MG PO TABS: 5.0000 mg | ORAL_TABLET | Freq: Two times a day (BID) | ORAL | 0 refills | Status: DC | PRN

## 2019-06-21 NOTE — ED Triage Notes (Signed)
Pt c/o rt neck stiffness and pain since yesterday. Denies injury

## 2019-06-21 NOTE — Discharge Instructions (Signed)
We gave you a shot of toradol, should begin working in 30-40 min Begin prednisone daily for 5 days, take in the morning with food if able You may use flexeril as needed to help with pain. This is a muscle relaxer and causes sedation- please use only at bedtime or when you will be home and not have to drive/work Gentle neck exercises- see attached Alternate ice and heat  Follow up if not resolving or worsening

## 2019-06-21 NOTE — ED Provider Notes (Signed)
[name]    CSN: [id number] Arrival date & time: 06/21/19  1435      History   Chief Complaint Chief Complaint  Patient presents with  . Neck Pain    HPI Frances Reed is a 28 y.o. female history of Klippel Herbie Drape Weber syndrome, presenting today for evaluation of neck pain and stiffness.  Patient states that yesterday evening she woke up with a lot of pain and stiffness throughout the right side of her neck.  She denies any injury or fall.  Approximately 1 week ago she did have a fall where she injured her right hand and, but no new injury since then.  Pain did not begin until last night.  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  She has had slight improvement in movement of neck and shoulders since she originally woke up.  She has taken some Tylenol.  Denies dizziness or lightheadedness.  Denies vision changes.  It has been tingling sensation into her right arm earlier, but is not been persistent.  Allergies clarified and states that she had itching with taking ibuprofen a long time ago.  Denies shortness of breath or difficulty breathing.  HPI  Past Medical History:  Diagnosis Date  . Generalized anxiety disorder 06/13/2015  . History of kidney stones   . Klippel-Trenaunay-Weber syndrome   . Klippel-Trenaunay-Weber syndrome   . Sleep apnea     Patient Active Problem List   Diagnosis Date Noted  . Pain of right lower leg 10/18/2015  . Generalized anxiety disorder 06/13/2015  . Carpal tunnel syndrome 01/18/2015  . 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 FOOT SURGERY Bilateral   . LAPAROSCOPY N/A 05/21/2019   Procedure: LAPAROSCOPY DIAGNOSTIC, Left Pelvic Cystectomy ;  Surgeon: Chancy Milroy, MD;  Location: [place];  Service: Gynecology;  Laterality: N/A;  . SKIN GRAFT Right 2000   abdominal graft for foot  . THROAT  SURGERY    . TONSILLECTOMY      OB History    Gravida  1   Para  1   Term  1   Preterm      AB      Living  1     SAB      TAB      Ectopic      Multiple      Live Births  1            Home Medications    Prior to Admission medications   Medication Sig Start Date End Date Taking? Authorizing Provider  acetaminophen (TYLENOL) 500 MG tablet Take 1,000 mg by mouth every 6 (six) hours as needed for mild pain.    [provider]  clonazePAM (KLONOPIN) 0.5 MG tablet Take 0.5 tablets (0.25 mg total) by mouth 2 (two) times daily as needed for anxiety. Patient taking differently: Take 0.5 mg by mouth daily as needed for anxiety.  04/10/17   Roselee Nova, MD  cyclobenzaprine (FLEXERIL) 5 MG tablet Take 1-2 tablets (5-10 mg total) by mouth 2 (two) times daily as needed for muscle spasms. 06/21/19   Zlatan Hornback C, PA-C  gabapentin (NEURONTIN) 100 MG capsule TAKE 1 CAPSULE BY MOUTH TWICE A DAY 06/24/17   Roselee Nova, MD  PARoxetine (PAXIL) 10 MG tablet Take 10 mg by mouth daily.    [provider]  predniSONE (DELTASONE) 50 MG tablet Take 1 tablet (50 mg total) by mouth daily with breakfast. 06/21/19   Corri Delapaz, Junius Creamer, PA-C    Family History No family history on file.  Social History Social History   Tobacco Use  . Smoking status: Never Smoker  . Smokeless tobacco: Current User    Types: Chew  Substance Use Topics  . Alcohol use: Yes    Alcohol/week: 3.0 standard drinks    Types: 3 Cans of beer per week    Comment: per week  . Drug use: No     Allergies   Coconut oil and Motrin [ibuprofen]   Review of Systems Review of Systems  Constitutional: Negative for fatigue and fever.  Eyes: Negative for visual disturbance.  Respiratory: Negative for shortness of breath.   Cardiovascular: Negative for chest pain.  Gastrointestinal: Negative for abdominal pain, nausea and vomiting.  Musculoskeletal: Positive for myalgias, neck  pain and neck stiffness. Negative for arthralgias and joint swelling.  Skin: Negative for color change, rash and wound.  Neurological: Negative for dizziness, weakness, light-headedness and headaches.     Physical Exam Triage Vital Signs ED Triage Vitals [06/21/19 1447]  Enc Vitals Group     BP 135/85     Pulse Rate 74     Resp 18     Temp 97.9 F (36.6 C)     Temp Source Oral     SpO2 99 %     Weight      Height      Head Circumference      Peak Flow      Pain Score 7     Pain Loc      Pain Edu?      Excl. in GC?    No data found.  Updated Vital Signs BP 135/85 (BP Location: Left Arm)   Pulse 74   Temp 97.9 F (36.6 C) (Oral)   Resp 18   LMP 06/08/2019   SpO2 99%   Visual Acuity Right Eye Distance:   Left Eye Distance:   Bilateral Distance:    Right Eye Near:   Left Eye Near:    Bilateral Near:     Physical Exam Vitals signs and nursing note reviewed.  Constitutional:      Appearance: She is well-developed.     Comments: No acute distress  HENT:     Head: Normocephalic and atraumatic.     Nose: Nose normal.  Eyes:     Conjunctiva/sclera: Conjunctivae normal.  Neck:     Musculoskeletal: Neck supple.  Cardiovascular:     Rate and Rhythm: Normal rate.  Pulmonary:     Effort: Pulmonary effort is normal. No respiratory distress.  Abdominal:     General: There is no distension.  Musculoskeletal: Normal range of motion.     Comments: Patient holding neck in slight leftward rotation, nontender to palpation along cervical spine midline, increased tenderness throughout right cervical musculature extending into superior right trapezius musculature and periscapular area  Limited range of motion with rightward rotation, full flexion and extension of neck Full active range of motion of right shoulder  Skin:    General: Skin is warm and dry.  Neurological:     Mental Status: She is alert and oriented to person, place, and time.      UC Treatments /  Results  Labs (all labs ordered are listed, but only abnormal results are displayed) Labs Reviewed - No data to display  EKG  Radiology No results found.  Procedures Procedures (including critical care time)  Medications Ordered in UC Medications  ketorolac (TORADOL) injection 30 mg (30 mg Intramuscular Given 06/21/19 1521)    Initial Impression / Assessment and Plan / UC Course  I have reviewed the triage vital signs and the nursing notes.  Pertinent labs & imaging results that were available during my care of the patient were reviewed by me and considered in my medical decision making (see chart for details).     Patient appears to have cervical/trapezius strain of right side.  No mechanism of injury, no midline tenderness, do not suspect acute bony abnormality.  Will provide Toradol prior to discharge and will continue on course of prednisone.  Supplement with Flexeril as needed at bedtime.  Discussed gentle neck exercises to regain full range of motion of neck.  Continue to monitor,Discussed strict return precautions. Patient verbalized understanding and is agreeable with plan.  Final Clinical Impressions(s) / UC Diagnoses   Final diagnoses:  Cervical strain, acute, initial encounter  Strain of right trapezius muscle, initial encounter     Discharge Instructions     We gave you a shot of toradol, should begin working in 30-40 min Begin prednisone daily for 5 days, take in the morning with food if able You may use flexeril as needed to help with pain. This is a muscle relaxer and causes sedation- please use only at bedtime or when you will be home and not have to drive/work Gentle neck exercises- see attached Alternate ice and heat  Follow up if not resolving or worsening   ED Prescriptions    Medication Sig Dispense Auth. Provider   predniSONE (DELTASONE) 50 MG tablet Take 1 tablet (50 mg total) by mouth daily with breakfast. 5 tablet Cande Mastropietro C, PA-C    cyclobenzaprine (FLEXERIL) 5 MG tablet Take 1-2 tablets (5-10 mg total) by mouth 2 (two) times daily as needed for muscle spasms. 24 tablet Kaiya Boatman, [place] C, PA-C     PDMP not reviewed this encounter.   Lew DawesWieters, Anmarie Fukushima C, [place] JerseyPA-C 06/21/19 1615

## 2019-06-24 ENCOUNTER — Ambulatory Visit: Payer: Medicaid Other | Admitting: Obstetrics and Gynecology

## 2019-06-29 ENCOUNTER — Other Ambulatory Visit: Payer: Self-pay

## 2019-06-29 DIAGNOSIS — Z20822 Contact with and (suspected) exposure to covid-19: Secondary | ICD-10-CM

## 2019-06-30 LAB — NOVEL CORONAVIRUS, NAA: SARS-CoV-2, NAA: NOT DETECTED

## 2019-07-05 ENCOUNTER — Other Ambulatory Visit: Payer: Self-pay

## 2019-07-05 DIAGNOSIS — Z20822 Contact with and (suspected) exposure to covid-19: Secondary | ICD-10-CM

## 2019-07-06 LAB — NOVEL CORONAVIRUS, NAA: SARS-CoV-2, NAA: NOT DETECTED

## 2019-07-15 ENCOUNTER — Ambulatory Visit: Payer: Medicaid Other | Admitting: Obstetrics and Gynecology

## 2019-08-06 ENCOUNTER — Ambulatory Visit (INDEPENDENT_AMBULATORY_CARE_PROVIDER_SITE_OTHER): Payer: Self-pay | Admitting: Obstetrics and Gynecology

## 2019-08-06 ENCOUNTER — Other Ambulatory Visit: Payer: Self-pay

## 2019-08-06 ENCOUNTER — Encounter: Payer: Self-pay | Admitting: Obstetrics and Gynecology

## 2019-08-06 ENCOUNTER — Other Ambulatory Visit (HOSPITAL_COMMUNITY)
Admission: RE | Admit: 2019-08-06 | Discharge: 2019-08-06 | Disposition: A | Discharge status: Home / Self Care | Payer: Medicaid Other | Source: Ambulatory Visit | Attending: Obstetrics and Gynecology | Admitting: Obstetrics and Gynecology

## 2019-08-06 DIAGNOSIS — N871 Moderate cervical dysplasia: Secondary | ICD-10-CM

## 2019-08-06 DIAGNOSIS — R87612 Low grade squamous intraepithelial lesion on cytologic smear of cervix (LGSIL): Secondary | ICD-10-CM

## 2019-08-06 LAB — POCT PREGNANCY, URINE: Preg Test, Ur: NEGATIVE

## 2019-08-06 NOTE — Patient Instructions (Signed)
Colposcopy, Care After This sheet gives you information about how to care for yourself after your procedure. Your doctor may also give you more specific instructions. If you have problems or questions, contact your doctor. What can I expect after the procedure? If you did not have a tissue sample removed (did not have a biopsy), you may only have some spotting for a few days. You can go back to your normal activities. If you had a tissue sample removed, it is common to have:  Soreness and pain. This may last for a few days.  Light-headedness.  Mild bleeding from your vagina or dark-colored, grainy discharge from your vagina. This may last for a few days. You may need to wear a sanitary pad.  Spotting for at least 48 hours after the procedure. Follow these instructions at home:   Take over-the-counter and prescription medicines only as told by your doctor. Ask your doctor what medicines you can start taking again. This is very important if you take blood-thinning medicine.  Do not drive or use heavy machinery while taking prescription pain medicine.  For 3 days, or as long as your doctor tells you, avoid: ? Douching. ? Using tampons. ? Having sex.  If you use birth control (contraception), keep using it.  Limit activity for the first day after the procedure. Ask your doctor what activities are safe for you.  It is up to you to get the results of your procedure. Ask your doctor when your results will be ready.  Keep all follow-up visits as told by your doctor. This is important. Contact a doctor if:  You get a skin rash. Get help right away if:  You are bleeding a lot from your vagina. It is a lot of bleeding if you are using more than one pad an hour for 2 hours in a row.  You have clumps of blood (blood clots) coming from your vagina.  You have a fever.  You have chills  You have pain in your lower belly (pelvic area).  You have signs of infection, such as vaginal  discharge that is: ? Different than usual. ? Yellow. ? Bad-smelling.  You have very pain or cramps in your lower belly that do not get better with medicine.  You feel light-headed.  You feel dizzy.  You pass out (faint). Summary  If you did not have a tissue sample removed (did not have a biopsy), you may only have some spotting for a few days. You can go back to your normal activities.  If you had a tissue sample removed, it is common to have mild pain and spotting for 48 hours.  For 3 days, or as long as your doctor tells you, avoid douching, using tampons and having sex.  Get help right away if you have bleeding, very bad pain, or signs of infection. This information is not intended to replace advice given to you by your health care provider. Make sure you discuss any questions you have with your health care provider. Document Released: 01/29/2008 Document Revised: 07/25/2017 Document Reviewed: 05/01/2016 Elsevier Patient Education  2020 [employer]  

## 2019-08-06 NOTE — Progress Notes (Signed)
    GYNECOLOGY CLINIC COLPOSCOPY PROCEDURE NOTE  28 y.o. 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 Discussed  cervical dysplasia, need for surveillance.  Patient given informed consent, signed copy in the chart, time out was performed.  Placed in lithotomy position. Cervix viewed with speculum and colposcope after application of acetic acid.   Colposcopy adequate? Yes  acetowhite lesion(s) noted at 12 o'clock; corresponding biopsies obtained.  ECC specimen obtained. All specimens were labelled and sent to pathology. Monsel's applied and hemostasis noted.   Patient was given post procedure instructions.  Will follow up pathology and manage accordingly.  Routine preventative health maintenance measures emphasized.     Arlina Robes, MD, [place] Attending [place] for [place]

## 2019-08-10 LAB — SURGICAL PATHOLOGY

## 2019-09-16 ENCOUNTER — Other Ambulatory Visit: Payer: Self-pay

## 2019-09-16 ENCOUNTER — Emergency Department (HOSPITAL_COMMUNITY): Payer: Self-pay

## 2019-09-16 ENCOUNTER — Emergency Department (HOSPITAL_BASED_OUTPATIENT_CLINIC_OR_DEPARTMENT_OTHER): Payer: Self-pay

## 2019-09-16 ENCOUNTER — Emergency Department (HOSPITAL_COMMUNITY)
Admission: EM | Admit: 2019-09-16 | Discharge: 2019-09-16 | Disposition: A | Discharge status: Home / Self Care | Payer: Self-pay | Attending: Emergency Medicine | Admitting: Emergency Medicine

## 2019-09-16 ENCOUNTER — Encounter (HOSPITAL_COMMUNITY): Payer: Self-pay | Admitting: Emergency Medicine

## 2019-09-16 ENCOUNTER — Ambulatory Visit (HOSPITAL_COMMUNITY)
Admission: EM | Admit: 2019-09-16 | Discharge: 2019-09-16 | Disposition: A | Discharge status: Home / Self Care | Payer: Medicaid Other

## 2019-09-16 DIAGNOSIS — F17200 Nicotine dependence, unspecified, uncomplicated: Secondary | ICD-10-CM | POA: Insufficient documentation

## 2019-09-16 DIAGNOSIS — R600 Localized edema: Secondary | ICD-10-CM | POA: Insufficient documentation

## 2019-09-16 DIAGNOSIS — Z7901 Long term (current) use of anticoagulants: Secondary | ICD-10-CM | POA: Insufficient documentation

## 2019-09-16 DIAGNOSIS — M7989 Other specified soft tissue disorders: Secondary | ICD-10-CM

## 2019-09-16 DIAGNOSIS — R0789 Other chest pain: Secondary | ICD-10-CM | POA: Insufficient documentation

## 2019-09-16 DIAGNOSIS — R0602 Shortness of breath: Secondary | ICD-10-CM | POA: Insufficient documentation

## 2019-09-16 DIAGNOSIS — R079 Chest pain, unspecified: Secondary | ICD-10-CM

## 2019-09-16 DIAGNOSIS — Z79899 Other long term (current) drug therapy: Secondary | ICD-10-CM | POA: Insufficient documentation

## 2019-09-16 DIAGNOSIS — R52 Pain, unspecified: Secondary | ICD-10-CM

## 2019-09-16 LAB — TROPONIN I (HIGH SENSITIVITY)
Troponin I (High Sensitivity): <2 ng/L (ref <18)
Troponin I (High Sensitivity): <2 ng/L (ref <18)

## 2019-09-16 LAB — CBC
HCT: 37.5 % (ref 36.0–46.0)
Hemoglobin: 12.3 g/dL (ref 12.0–15.0)
MCH: 31.5 pg (ref 26.0–34.0)
MCHC: 32.8 g/dL (ref 30.0–36.0)
MCV: 96.2 fL (ref 80.0–100.0)
Platelets: 213 10*3/uL (ref 150–400)
RBC: 3.9 MIL/uL (ref 3.87–5.11)
RDW: 12.8 % (ref 11.5–15.5)
WBC: 4 10*3/uL (ref 4.0–10.5)
nRBC: 0 % (ref 0.0–0.2)

## 2019-09-16 LAB — BASIC METABOLIC PANEL
Anion gap: 9 (ref 5–15)
BUN: 14 mg/dL (ref 6–20)
CO2: 22 mmol/L (ref 22–32)
Calcium: 8.9 mg/dL (ref 8.9–10.3)
Chloride: 105 mmol/L (ref 98–111)
Creatinine, Ser: 0.92 mg/dL (ref 0.44–1.00)
GFR calc Af Amer: >60 mL/min (ref >60)
GFR calc non Af Amer: >60 mL/min (ref >60)
Glucose, Bld: 93 mg/dL (ref 70–99)
Potassium: 4 mmol/L (ref 3.5–5.1)
Sodium: 136 mmol/L (ref 135–145)

## 2019-09-16 LAB — D-DIMER, QUANTITATIVE: D-Dimer, Quant: 0.7 ug/mL-FEU — ABNORMAL HIGH (ref 0.00–0.50)

## 2019-09-16 LAB — I-STAT BETA HCG BLOOD, ED (MC, WL, AP ONLY): I-stat hCG, quantitative: <5 m[IU]/mL (ref <5)

## 2019-09-16 MED ORDER — IOHEXOL 350 MG/ML SOLN
100.0000 mL | Freq: Once | INTRAVENOUS | Status: AC | PRN
  Administered 2019-09-16: 100 mL via INTRAVENOUS

## 2019-09-16 NOTE — Discharge Instructions (Addendum)
Please follow-up with your primary care provider regarding today's encounter.  Given the palpitations and shortness of breath, you may require Holter monitor to evaluate for arrhythmia.  Your EKG was reassuring here in the ED.  All of your laboratory work-up and imaging was normal.  It is tough to definitively state why you were experiencing your acute onset chest pain and shortness of breath symptoms.  As for your calf discomfort, it is likely musculoskeletal. Please consider NSAIDs, as needed.  Please return to the ED or seek medical attention for any new or worsening symptoms.  Otherwise, follow-up with your PCP who may refer you to cardiology for ongoing evaluation and management.

## 2019-09-16 NOTE — ED Notes (Signed)
Verbalized understanding of DC instructions and follow up care 

## 2019-09-16 NOTE — ED Triage Notes (Signed)
Pt endorses CP and SOB starting today, right calf pain for 3 days. Reports falling down a few steps 4 days. Pain worse with exertion.

## 2019-09-16 NOTE — ED Provider Notes (Signed)
[facility] Provider Note   CSN: [id number] Arrival date & time: 09/16/19  0848     History Chief Complaint  Patient presents with  . Chest Pain    Frances Reed is a 29 y.o. female with PMH significant for Klippel-Trenaunary-Weber syndrome (KTWS) who presents to the ED with right-sided calf discomfort and "heaviness" x3 days as well as acute onset chest discomfort and shortness of breath that began this morning while at work.  Patient reports that she tripped over her dog 4 days ago when she stumbled down approximately 3 steps.  She denies any head injury or serious pain or discomfort subsequent to her fall.  However, the following day she noticed right-sided calf swelling and discomfort, worse with ambulation.  She is surprised that it has not since resolved and she denies any other lower leg discomfort.  She reports that she has full ROM of the joints and strength is intact, however her right calf continues to feel "larger" and "heavy" with some associated discomfort.  However, it was her acute onset chest discomfort and shortness of breath that began this morning while resting comfortably at work is what prompted her to come to the ED for evaluation.  Chest pain worse with deep inspiration.  She admits that she does have a history of anxiety and is unsure that that is playing a role, however concern is related to her increased risk of clots.  She denies any diaphoresis, headache or dizziness, fevers or chills, cough, abdominal discomfort, nausea or vomiting, radiation of chest pain, history of clots, or other symptoms.  HPI     Past Medical History:  Diagnosis Date  . Generalized anxiety disorder 06/13/2015  . History of kidney stones   . Klippel-Trenaunay-Weber syndrome   . Klippel-Trenaunay-Weber syndrome   . Sleep apnea     Patient Active Problem List   Diagnosis Date Noted  . 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 Pain of right lower  leg 10/18/2015  . Generalized anxiety disorder 06/13/2015  . Carpal tunnel syndrome 01/18/2015  . 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 FOOT SURGERY Bilateral   . LAPAROSCOPY N/A 05/21/2019   Procedure: LAPAROSCOPY DIAGNOSTIC, Left Pelvic Cystectomy ;  Surgeon: Chancy Milroy, MD;  Location: [facility];  Service: Gynecology;  Laterality: N/A;  . SKIN GRAFT Right 2000   abdominal graft for foot  . THROAT SURGERY    . TONSILLECTOMY       OB History    Gravida  1   Para  1   Term  1   Preterm      AB      Living  1     SAB      TAB      Ectopic      Multiple      Live Births  1           No family history on file.  Social History   Tobacco Use  . Smoking status: Never Smoker  . Smokeless tobacco: Current User    Types: Chew  Substance Use Topics  . Alcohol use: Yes    Alcohol/week: 3.0 standard drinks    Types: 3 Cans of beer per week    Comment: per week  . Drug use: No    Home Medications Prior to Admission medications   Medication Sig Start Date End Date Taking? Authorizing Provider  clonazePAM (  KLONOPIN) 0.5 MG tablet Take 0.5 tablets (0.25 mg total) by mouth 2 (two) times daily as needed for anxiety. Patient taking differently: Take 0.5 mg by mouth daily as needed for anxiety.  04/10/17  Yes Ellyn Hack, MD  gabapentin (NEURONTIN) 100 MG capsule TAKE 1 CAPSULE BY MOUTH TWICE A DAY Patient taking differently: Take 100 mg by mouth 2 (two) times daily.  06/24/17  Yes Ellyn Hack, MD  PARoxetine (PAXIL) 10 MG tablet Take 10 mg by mouth daily.   Yes [provider]    Allergies    Coconut oil and Motrin [ibuprofen]  Review of Systems   Review of Systems  All other systems reviewed and are negative.   Physical Exam Updated Vital Signs BP 112/78   Pulse 63   Temp 98.4 F (36.9 C) (Oral)   Resp 14   Ht 5\' 2"  (1.575 m)   Wt 77.1 kg   LMP 08/30/2019   SpO2  100%   BMI 31.09 kg/m   Physical Exam Vitals and nursing note reviewed. Exam conducted with a chaperone present.  Constitutional:      Appearance: Normal appearance.  HENT:     Head: Normocephalic and atraumatic.  Eyes:     General: No scleral icterus.    Conjunctiva/sclera: Conjunctivae normal.  Cardiovascular:     Rate and Rhythm: Normal rate and regular rhythm.     Pulses: Normal pulses.     Heart sounds: Normal heart sounds.  Pulmonary:     Effort: Pulmonary effort is normal. No respiratory distress.     Breath sounds: Normal breath sounds. No wheezing or rales.  Musculoskeletal:     Comments: Left extremities chronically larger and more swollen than right extremities. Right leg: TTP right calf, varicosities noted.  No significant erythema or swelling, particular when compared to chronically enlarged left calf.  Varicosities throughout.  Pedal pulse intact.  Sensation intact throughout.  No diminished ROM or strength.  Skin:    General: Skin is dry.     Capillary Refill: Capillary refill takes less than 2 seconds.     Comments: Port wine stains diffusely.  Neurological:     Mental Status: She is alert and oriented to person, place, and time.     GCS: GCS eye subscore is 4. GCS verbal subscore is 5. GCS motor subscore is 6.  Psychiatric:        Mood and Affect: Mood normal.        Behavior: Behavior normal.        Thought Content: Thought content normal.     ED Results / Procedures / Treatments   Labs (all labs ordered are listed, but only abnormal results are displayed) Labs Reviewed  D-DIMER, QUANTITATIVE (NOT AT [facility]) - Abnormal; Notable for the following components:      Result Value   D-Dimer, Quant 0.70 (*)    All other components within normal limits  BASIC METABOLIC PANEL  CBC  I-STAT BETA HCG BLOOD, ED ([facility], [facility], [facility] ONLY)  TROPONIN I (HIGH SENSITIVITY)  TROPONIN I (HIGH SENSITIVITY)    EKG None  Radiology DG Chest 2 View  Result Date:  09/16/2019 CLINICAL DATA:  Chest pain and SOB. EXAM: CHEST - 2 VIEW COMPARISON:  07/31/2018 FINDINGS: The heart size and mediastinal contours are within normal limits. Both lungs are clear. The visualized skeletal structures are unremarkable. IMPRESSION: No active cardiopulmonary disease. Electronically Signed   By: 14/01/2018 M.D.   On: 09/16/2019  09:30   CT Angio Chest PE W and/or Wo Contrast  Result Date: 09/16/2019 CLINICAL DATA:  Shortness of breath EXAM: CT ANGIOGRAPHY CHEST WITH CONTRAST TECHNIQUE: Multidetector CT imaging of the chest was performed using the standard protocol during bolus administration of intravenous contrast. Multiplanar CT image reconstructions and MIPs were obtained to evaluate the vascular anatomy. CONTRAST:  OMNIPAQUE IOHEXOL 350 MG/ML SOLN COMPARISON:  CT 07/31/2018, 04/04/2017 FINDINGS: Cardiovascular: Satisfactory opacification of the pulmonary arteries to the segmental level. No evidence of pulmonary embolism. Normal heart size. No pericardial effusion. Nonaneurysmal aorta. Mediastinum/Nodes: No enlarged mediastinal, hilar, or axillary lymph nodes. Thyroid gland, trachea, and esophagus demonstrate no significant findings. Lungs/Pleura: Small cyst at the right apex. No consolidation, pleural effusion or pneumothorax. Upper Abdomen: No acute abnormality. 3 cm soft tissue mass at the left splenic hilus likely a large splenule. Additional hypodense splenic lesions are again noted. Musculoskeletal: Mild chronic wedging T6. No acute or suspicious osseous abnormality. Review of the MIP images confirms the above findings. IMPRESSION: 1. Negative for acute pulmonary embolus. 2. Clear lung fields Electronically Signed   By: Jasmine Pang M.D.   On: 09/16/2019 17:20   VAS [place] LOWER EXTREMITY VENOUS (DVT) (ONLY [facility] & [facility] 7a-7p)  Result Date: 09/16/2019  Lower Venous Study Indications: Pain, and Swelling.  Comparison Study: No prior study. Performing Technologist: Kennedy Bucker ARDMS, RVT  Examination Guidelines: A complete evaluation includes B-mode imaging, spectral Doppler, color Doppler, and power Doppler as needed of all accessible portions of each vessel. Bilateral testing is considered an integral part of a complete examination. Limited examinations for reoccurring indications may be performed as noted.  +---------+---------------+---------+-----------+----------+--------------+ RIGHT    CompressibilityPhasicitySpontaneityPropertiesThrombus Aging +---------+---------------+---------+-----------+----------+--------------+ CFV      Full           Yes      Yes                                 +---------+---------------+---------+-----------+----------+--------------+ SFJ      Full                                                        +---------+---------------+---------+-----------+----------+--------------+ FV Prox  Full                                                        +---------+---------------+---------+-----------+----------+--------------+ FV Mid   Full                                                        +---------+---------------+---------+-----------+----------+--------------+ FV DistalFull                                                        +---------+---------------+---------+-----------+----------+--------------+ PFV      Full                                                        +---------+---------------+---------+-----------+----------+--------------+  POP      Full           Yes      Yes                                 +---------+---------------+---------+-----------+----------+--------------+ PTV      Full                                                        +---------+---------------+---------+-----------+----------+--------------+ PERO     Full                                                        +---------+---------------+---------+-----------+----------+--------------+  Poorly visualized calf veins due to swelling. Hypoechoic round structure with no blood flow seen in right pop fossa.  +----+---------------+---------+-----------+----------+--------------+ LEFTCompressibilityPhasicitySpontaneityPropertiesThrombus Aging +----+---------------+---------+-----------+----------+--------------+ CFV Full           Yes      Yes                                 +----+---------------+---------+-----------+----------+--------------+     Summary: Right: There is no evidence of deep vein thrombosis in the lower extremity. A hypoechoic, round structure is found in the popliteal fossa. Left: No evidence of common femoral vein obstruction. Ultrasound characteristics of enlarged lymph nodes noted in the groin.  *See table(s) above for measurements and observations. Electronically signed by Sherald Hess MD on 09/16/2019 at 5:01:19 PM.    Final     Procedures Procedures (including critical care time)  Medications Ordered in ED Medications  iohexol (OMNIPAQUE) 350 MG/ML injection 100 mL (100 mLs Intravenous Contrast Given 09/16/19 1705)    ED Course  I have reviewed the triage vital signs and the nursing notes.  Pertinent labs & imaging results that were available during my care of the patient were reviewed by me and considered in my medical decision making (see chart for details).    MDM Rules/Calculators/A&P                      DG chest demonstrates no active cardiopulmonary disease.  EKG demonstrated normal sinus rhythm and her initial troponin was less than 2.  BMP demonstrates no electrolyte abnormalities or renal impairment.  Initial troponin and repeat troponin were both less than 2.  CBC all within normal limits.  Her vital signs have also all been within normal limits throughout the entire duration of her stay in the ED.  When instructed patient to take a deep breath, she reported pleuritic chest discomfort.  She also reports that with her Klippel-Trenaunay  Syndrome she is hypercoagulable and required anticoagulation while pregnant.  Discussed case with Dr. Clarice Pole who agrees with plan to obtain D-dimer and assess for DVT given her KTWS in addition to concerning physical exam findings.  Venous ultrasound demonstrates no evidence of DVT in the lower extremity.  D-dimer came back elevated 0.70, will obtain CT angio study to rule out pulmonary embolism given her acute onset shortness of breath and chest pain.  CTA PE  study revealed no acute abnormalities or evidence of a pulmonary embolism.  Discussed the results of today's comprehensive evaluation with patient as well as her daughter over the phone.  Both are expressed understanding and appreciation regarding today's comprehensive work-up.  Patient was brought to tears and reported that she had recently been made supervisor at work and had her first large meeting scheduled for today.  Given her history of anxiety, suspect that that may be a contributing factor.  She also reports that she felt palpitations during the episode of shortness of breath, so I suggested that she follow-up with her primary care provider and perhaps be placed on a Holter monitor for evaluation of arrhythmia.    Strict return precautions discussed with the patient and her father. All of the evaluation and work-up results were discussed with the patient and any family at bedside. They were provided opportunity to ask any additional questions and have none at this time. They have expressed understanding of verbal discharge instructions as well as return precautions and are agreeable to the plan.    Final Clinical Impression(s) / ED Diagnoses Final diagnoses:  Nonspecific chest pain    Rx / DC Orders ED Discharge Orders    None       Lorelee NewGreen, Vyron Fronczak L, PA-C 09/16/19 1805    Arby BarrettePfeiffer, Marcy, MD 09/17/19 [phone]

## 2019-09-16 NOTE — Progress Notes (Signed)
Right lower extremity venous duplex is performed.  Preliminary results can be found under CV proc under chart review.  09/16/2019 1:08 PM  Brittny Spangle, K., RDMS, RVT

## 2019-09-16 NOTE — ED Notes (Signed)
Per pt access, pt arrived to [facility] on accident, was looking for the best parking for presentation to the ED. Pt given directions.

## 2019-09-20 ENCOUNTER — Encounter: Payer: Self-pay | Admitting: Cardiology

## 2019-09-20 DIAGNOSIS — Z7189 Other specified counseling: Secondary | ICD-10-CM | POA: Insufficient documentation

## 2019-09-20 DIAGNOSIS — R072 Precordial pain: Secondary | ICD-10-CM | POA: Insufficient documentation

## 2019-09-20 NOTE — Progress Notes (Signed)
Cardiology Office Note   Date:  09/21/2019   ID:  Frances Reed, DOB 07/13/1991, MRN [id number]  PCP:  Patient, No Pcp Per  Cardiologist:   No primary care provider on file. Referring:  Self   No chief complaint on file.     History of Present Illness: Frances Reed is a 29 y.o. female who is referred by herself for evaluation of chest pain.   She was in the ED for this a couple of days ago.  There was no evidence for an ischemic etiology.  Because of leg swelling and her past history she had a venous Doppler.  There was no evidence of DVT.  I reviewed these records for this visit.    The patient reports that she was working the third shift as a Web designer.  She developed some mid sharp chest discomfort with some shortness of breath.  This was off and on for a couple of minutes.  She drove home and she had a similar episode.  This was last week and since then she has continued to have these episodic sharp chest pains.  They are happening daily.  She might feel her heart racing with them and in fact thinks that that consistently happens.  It is left-sided and a little bit mid.  She does not get lightheaded or presyncopal.  However, it is unnerving to her.  She does activities such as walking upstairs and takes care of her 42-year-old as well as working as a Web designer.  She cannot bring these on.  She has never had these symptoms before.  She had syncope sometime ago that she was told was related to anxiety but this has not been a recurrent event.  Past Medical History:  Diagnosis Date  . Generalized anxiety disorder 06/13/2015  . History of kidney stones   . Klippel-Trenaunay-Weber syndrome   . Sleep apnea     Past Surgical History:  Procedure Laterality Date  . FOOT SURGERY Bilateral   . LAPAROSCOPY N/A 05/21/2019   Procedure: LAPAROSCOPY DIAGNOSTIC, Left Pelvic Cystectomy ;  Surgeon: Chancy Milroy, MD;  Location: [place];  Service: Gynecology;  Laterality: N/A;  . SKIN GRAFT Right  2000   abdominal graft for foot  . THROAT SURGERY    . TONSILLECTOMY       Current Outpatient Medications  Medication Sig Dispense Refill  . clonazePAM (KLONOPIN) 0.5 MG tablet Take 0.5 tablets (0.25 mg total) by mouth 2 (two) times daily as needed for anxiety. 60 tablet 2  . gabapentin (NEURONTIN) 100 MG capsule TAKE 1 CAPSULE BY MOUTH TWICE A DAY 180 capsule 0  . PARoxetine (PAXIL) 10 MG tablet Take 10 mg by mouth daily.     No current facility-administered medications for this visit.    Allergies:   Coconut oil and Motrin [ibuprofen]    Social History:  The patient  reports that she has never smoked. Her smokeless tobacco use includes chew. She reports current alcohol use of about 3.0 standard drinks of alcohol per week. She reports that she does not use drugs.   Family History:  The patient's family history is not on file.    ROS:  Please see the history of present illness.   Otherwise, review of systems are positive for none.   All other systems are reviewed and negative.    PHYSICAL EXAM: VS:  BP 110/70   Pulse 71   Ht 5\' 2"  (1.575 m)   Wt 176 lb (  79.8 kg)   LMP 08/30/2019   BMI 32.19 kg/m  , BMI Body mass index is 32.19 kg/m. GENERAL:  Well appearing HEENT:  Pupils equal round and reactive, fundi not visualized, oral mucosa unremarkable NECK:  No jugular venous distention, waveform within normal limits, carotid upstroke brisk and symmetric, no bruits, no thyromegaly LYMPHATICS:  No cervical, inguinal adenopathy LUNGS:  Clear to auscultation bilaterally BACK:  No CVA tenderness CHEST:  Unremarkable HEART:  PMI not displaced or sustained,S1 and S2 within normal limits, no S3, no S4, no clicks, no rubs, no murmurs ABD:  Flat, positive bowel sounds normal in frequency in pitch, no bruits, no rebound, no guarding, no midline pulsatile mass, no hepatomegaly, no splenomegaly EXT:  2 plus pulses throughout, mild leg edema, no cyanosis no clubbing SKIN:  No rashes no  nodules NEURO:  Cranial nerves II through XII grossly intact, motor grossly intact throughout PSYCH:  Cognitively intact, oriented to person place and time    EKG:  EKG is ordered today. The ekg ordered today demonstrates sinus rhythm, rate 71, axis within normal limits, intervals within normal limits, no acute ST-T wave changes.   Recent Labs: 05/18/2019: ALT 12 09/16/2019: BUN 14; Creatinine, Ser 0.92; Hemoglobin 12.3; Platelets 213; Potassium 4.0; Sodium 136    Lipid Panel No results found for: CHOL, TRIG, HDL, CHOLHDL, VLDL, LDLCALC, LDLDIRECT    Wt Readings from Last 3 Encounters:  09/21/19 176 lb (79.8 kg)  09/16/19 170 lb (77.1 kg)  08/06/19 177 lb (80.3 kg)      Other studies Reviewed: Additional studies/ records that were reviewed today include: ED records. Review of the above records demonstrates:  Please see elsewhere in the note.     ASSESSMENT AND PLAN:  CHEST PAIN:    Her chest pain is atypical and might be an arrhythmia.  We talked about this and I would like her to have a 3-day Holter.  I have also given her written instructions to get a TSH.  For now we will hold off on treating with a beta-blocker unless she has significant symptoms between now and the time that I get the monitor back.  I do not suspect a structurally abnormal heart would have a low threshold if there are no arrhythmias to do an echocardiogram.  COVID EDUCATION: She has already been vaccinated.  Current medicines are reviewed at length with the patient today.  The patient does not have concerns regarding medicines.  The following changes have been made:  no change  Labs/ tests ordered today include:   Orders Placed This Encounter  Procedures  . LONG TERM MONITOR (3-14 DAYS)  . EKG 12-Lead     Disposition:   FU with me after the monitor.     Signed, Rollene Rotunda, MD  09/21/2019 5:35 PM    [facility]

## 2019-09-21 ENCOUNTER — Other Ambulatory Visit: Payer: Self-pay

## 2019-09-21 ENCOUNTER — Encounter: Payer: Self-pay | Admitting: Cardiology

## 2019-09-21 ENCOUNTER — Ambulatory Visit (INDEPENDENT_AMBULATORY_CARE_PROVIDER_SITE_OTHER): Payer: Self-pay | Admitting: Cardiology

## 2019-09-21 VITALS — BP 110/70 | HR 71 | Ht 62.0 in | Wt 176.0 lb

## 2019-09-21 DIAGNOSIS — R002 Palpitations: Secondary | ICD-10-CM

## 2019-09-21 DIAGNOSIS — R072 Precordial pain: Secondary | ICD-10-CM

## 2019-09-21 DIAGNOSIS — Z7189 Other specified counseling: Secondary | ICD-10-CM

## 2019-09-21 NOTE — Patient Instructions (Signed)
Medication Instructions:  No Changes *If you need a refill on your cardiac medications before your next appointment, please call your pharmacy*  Lab Work: Your physician recommends that you return for lab work with your PCP  If you have labs (blood work) drawn today and your tests are completely normal, you will receive your results only by: Marland Kitchen MyChart Message (if you have MyChart) OR . A paper copy in the mail If you have any lab test that is abnormal or we need to change your treatment, we will call you to review the results.  Testing/Procedures: Your physician has recommended that you wear an event monitor for 3 days. Event monitors are medical devices that record the heart's electrical activity. Doctors most often [place] these monitors to diagnose arrhythmias. Arrhythmias are problems with the speed or rhythm of the heartbeat. The monitor is a small, portable device. You can wear one while you do your normal daily activities. This is usually used to diagnose what is causing palpitations/syncope (passing out).    Follow-Up: At [facility], you and your health needs are our priority.  As part of our continuing mission to provide you with exceptional heart care, we have created designated Provider Care Teams.  These Care Teams include your primary Cardiologist (physician) and Advanced Practice Providers (APPs -  Physician Assistants and Nurse Practitioners) who all work together to provide you with the care you need, when you need it.  Your next appointment:   1 month(s)  The format for your next appointment:   In Person  Provider:   Rollene Rotunda, MD    Christena Deem- Long Term Monitor Instructions   Your physician has requested you wear your ZIO patch monitor_3__days.   This is a single patch monitor.  [employer] supplies one patch monitor per enrollment.  Additional stickers are not available.   Please do not apply patch if you will be having a Nuclear Stress Test, Echocardiogram,  Cardiac CT, MRI, or Chest Xray during the time frame you would be wearing the monitor. The patch cannot be worn during these tests.  You cannot remove and re-apply the ZIO XT patch monitor.   Your ZIO patch monitor will be sent USPS Priority mail from [facility] directly to your home address. The monitor may also be mailed to a PO BOX if home delivery is not available.   It may take 3-5 days to receive your monitor after you have been enrolled.   Once you have received you monitor, please review enclosed instructions.  Your monitor has already been registered assigning a specific monitor serial # to you.   Applying the monitor   Shave hair from upper left chest.   Hold abrader disc by orange tab.  Rub abrader in 40 strokes over left upper chest as indicated in your monitor instructions.   Clean area with 4 enclosed alcohol pads .  Use all pads to assure are is cleaned thoroughly.  Let dry.   Apply patch as indicated in monitor instructions.  Patch will be place under collarbone on left side of chest with arrow pointing upward.   Rub patch adhesive wings for 2 minutes.Remove white label marked "1".  Remove white label marked "2".  Rub patch adhesive wings for 2 additional minutes.   While looking in a mirror, press and release button in center of patch.  A small green light will flash 3-4 times .  This will be your only indicator the monitor has been turned on.  Do not shower for the first 24 hours.  You may shower after the first 24 hours.   Press button if you feel a symptom. You will hear a small click.  Record Date, Time and Symptom in the Patient Log Book.   When you are ready to remove patch, follow instructions on last 2 pages of Patient Log Book.  Stick patch monitor onto last page of Patient Log Book.   Place Patient Log Book in [place] box.  Use locking tab on box and tape box closed securely.  The [employer] has [employer] on it.  Please place in mailbox  as soon as possible.  Your physician should have your test results approximately 7 days after the monitor has been mailed back to [facility].   Call [facility] at [phone] if you have questions regarding your ZIO XT patch monitor.  Call them immediately if you see an orange light blinking on your monitor.   If your monitor falls off in less than 4 days contact our Monitor department at [phone].  If your monitor becomes loose or falls off after 4 days call [employer] at [phone] for suggestions on securing your monitor.

## 2019-09-29 ENCOUNTER — Telehealth: Payer: Self-pay | Admitting: Cardiology

## 2019-09-29 NOTE — Telephone Encounter (Signed)
Returned call to patient.She stated she has been struggling this week to work.Stated she had a episode of dizziness and blurred vision at work last night.Stated her PCP prescribed albuterol,zpac on Monday.She has been unable to reach PCP about a note to excuse her from work.Stated when she saw Dr.Hochrein he mentioned a note to be out of work.She would like Dr.Hochrein to write a note saying she is unable to work and will return to work this Sat night.She would like note faxed to her employer [employer] fax # [phone].Advised I will send message to Dr.Hochrein and his RN.Patient would like a call back when note is faxed.

## 2019-09-29 NOTE — Telephone Encounter (Signed)
Patient is calling to follow up in regards to requesting a written note for work, stating that she has still been experiencing symptoms of SOB and CP.   Pt c/o Shortness Of Breath: STAT if SOB developed within the last 24 hours or pt is noticeably SOB on the phone  1. Are you currently SOB (can you hear that pt is SOB on the phone)? No  2. How long have you been experiencing SOB? About 1 week  3. Are you SOB when sitting or when up moving around? Both  4. Are you currently experiencing any other symptoms? Dizziness, chest pain    Pt c/o of Chest Pain: STAT if CP now or developed within 24 hours  1. Are you having CP right now? No  2. Are you experiencing any other symptoms (ex. SOB, nausea, vomiting, sweating)? Dizziness, blurred vision  3. How long have you been experiencing CP? About 1 week  4. Is your CP continuous or coming and going? Coming and going  5. Have you taken Nitroglycerin? No ?

## 2019-09-29 NOTE — Telephone Encounter (Signed)
New Message  Patient is calling in to see if she could get a work note that writes her out a couple days. States that she is still having some of the same issues that she was having when she was seen on 09/21/19 and would like to take Dr. Antoine Poche up on his offer for a work note for 3-4 days. States that she was given an inhaler and other medications from another Doctor and just wants time for it to kick in. Please call patient and advise.

## 2019-09-30 NOTE — Telephone Encounter (Signed)
The work letter has been successfully faxed to [phone]

## 2019-10-06 ENCOUNTER — Telehealth: Payer: Self-pay | Admitting: Radiology

## 2019-10-06 NOTE — Telephone Encounter (Signed)
Enrolled patient for a 3 day Zio monitor to be mailed to patients home. Unable to verify insurance as patients mailbox has been full.

## 2019-10-20 DIAGNOSIS — R002 Palpitations: Secondary | ICD-10-CM | POA: Insufficient documentation

## 2019-10-20 NOTE — Progress Notes (Deleted)
Cardiology Office Note   Date:  10/20/2019   ID:  Frances Reed, DOB 01-01-1991, MRN [id number]  PCP:  Patient, No Pcp Per  Cardiologist:   No primary care provider on file. Referring:  Self   No chief complaint on file.     History of Present Illness: Frances Reed is a 29 y.o. female who was referred by herself for evaluation of chest pain.   I saw her for this.  This was very atypical and more likely related to palpitations.  I ordered a monitor. ***   *** She was in the ED for this a couple of days ago.  There was no evidence for an ischemic etiology.  Because of leg swelling and her past history she had a venous Doppler.  There was no evidence of DVT.  I reviewed these records for this visit.    The patient reports that she was working the third shift as a Web designer.  She developed some mid sharp chest discomfort with some shortness of breath.  This was off and on for a couple of minutes.  She drove home and she had a similar episode.  This was last week and since then she has continued to have these episodic sharp chest pains.  They are happening daily.  She might feel her heart racing with them and in fact thinks that that consistently happens.  It is left-sided and a little bit mid.  She does not get lightheaded or presyncopal.  However, it is unnerving to her.  She does activities such as walking upstairs and takes care of her 72-year-old as well as working as a Web designer.  She cannot bring these on.  She has never had these symptoms before.  She had syncope sometime ago that she was told was related to anxiety but this has not been a recurrent event.  Past Medical History:  Diagnosis Date  . Generalized anxiety disorder 06/13/2015  . History of kidney stones   . Klippel-Trenaunay-Weber syndrome   . Sleep apnea     Past Surgical History:  Procedure Laterality Date  . FOOT SURGERY Bilateral   . LAPAROSCOPY N/A 05/21/2019   Procedure: LAPAROSCOPY DIAGNOSTIC, Left Pelvic  Cystectomy ;  Surgeon: Chancy Milroy, MD;  Location: [facility];  Service: Gynecology;  Laterality: N/A;  . SKIN GRAFT Right 2000   abdominal graft for foot  . THROAT SURGERY    . TONSILLECTOMY       Current Outpatient Medications  Medication Sig Dispense Refill  . clonazePAM (KLONOPIN) 0.5 MG tablet Take 0.5 tablets (0.25 mg total) by mouth 2 (two) times daily as needed for anxiety. 60 tablet 2  . gabapentin (NEURONTIN) 100 MG capsule TAKE 1 CAPSULE BY MOUTH TWICE A DAY 180 capsule 0  . PARoxetine (PAXIL) 10 MG tablet Take 10 mg by mouth daily.     No current facility-administered medications for this visit.    Allergies:   Coconut oil and Motrin [ibuprofen]     ROS:  Please see the history of present illness.   Otherwise, review of systems are positive for ***.   All other systems are reviewed and negative.    PHYSICAL EXAM: VS:  There were no vitals taken for this visit. , BMI There is no height or weight on file to calculate BMI. ***GENERAL:  Well appearing NECK:  No jugular venous distention, waveform within normal limits, carotid upstroke brisk and symmetric, no bruits, no thyromegaly LUNGS:  Clear to  auscultation bilaterally CHEST:  Unremarkable HEART:  PMI not displaced or sustained,S1 and S2 within normal limits, no S3, no S4, no clicks, no rubs, *** murmurs ABD:  Flat, positive bowel sounds normal in frequency in pitch, no bruits, no rebound, no guarding, no midline pulsatile mass, no hepatomegaly, no splenomegaly EXT:  2 plus pulses throughout, no edema, no cyanosis no clubbing    *** GENERAL:  Well appearing HEENT:  Pupils equal round and reactive, fundi not visualized, oral mucosa unremarkable NECK:  No jugular venous distention, waveform within normal limits, carotid upstroke brisk and symmetric, no bruits, no thyromegaly LYMPHATICS:  No cervical, inguinal adenopathy LUNGS:  Clear to auscultation bilaterally BACK:  No CVA tenderness CHEST:  Unremarkable HEART:   PMI not displaced or sustained,S1 and S2 within normal limits, no S3, no S4, no clicks, no rubs, no murmurs ABD:  Flat, positive bowel sounds normal in frequency in pitch, no bruits, no rebound, no guarding, no midline pulsatile mass, no hepatomegaly, no splenomegaly EXT:  2 plus pulses throughout, mild leg edema, no cyanosis no clubbing SKIN:  No rashes no nodules NEURO:  Cranial nerves II through XII grossly intact, motor grossly intact throughout PSYCH:  Cognitively intact, oriented to person place and time    EKG:  EKG is *** ordered today.  The ekg ordered today demonstrates sinus rhythm, rate ***, axis within normal limits, intervals within normal limits, no acute ST-T wave changes.   Recent Labs: 05/18/2019: ALT 12 09/16/2019: BUN 14; Creatinine, Ser 0.92; Hemoglobin 12.3; Platelets 213; Potassium 4.0; Sodium 136    Lipid Panel No results found for: CHOL, TRIG, HDL, CHOLHDL, VLDL, LDLCALC, LDLDIRECT    Wt Readings from Last 3 Encounters:  09/21/19 176 lb (79.8 kg)  09/16/19 170 lb (77.1 kg)  08/06/19 177 lb (80.3 kg)      Other studies Reviewed: Additional studies/ records that were reviewed today include: *** Review of the above records demonstrates:  Please see elsewhere in the note.     ASSESSMENT AND PLAN:  CHEST PAIN:  ***  Her chest pain is atypical and might be an arrhythmia.  We talked about this and I would like her to have a 3-day Holter.  I have also given her written instructions to get a TSH.  For now we will hold off on treating with a beta-blocker unless she has significant symptoms between now and the time that I get the monitor back.  I do not suspect a structurally abnormal heart would have a low threshold if there are no arrhythmias to do an echocardiogram.  COVID EDUCATION:  ***  he has already been vaccinated.  Current medicines are reviewed at length with the patient today.  The patient does not have concerns regarding medicines.  The following  changes have been made:  ***  Labs/ tests ordered today include: ***  No orders of the defined types were placed in this encounter.    Disposition:   FU with me ***   Signed, Rollene Rotunda, MD  10/20/2019 8:15 PM    [facility]

## 2019-10-21 ENCOUNTER — Ambulatory Visit: Payer: Self-pay | Admitting: Cardiology

## 2019-11-03 ENCOUNTER — Encounter (HOSPITAL_COMMUNITY): Payer: Self-pay | Admitting: Emergency Medicine

## 2019-11-03 ENCOUNTER — Emergency Department (HOSPITAL_COMMUNITY): Payer: Self-pay

## 2019-11-03 ENCOUNTER — Other Ambulatory Visit: Payer: Self-pay

## 2019-11-03 ENCOUNTER — Emergency Department (HOSPITAL_COMMUNITY)
Admission: EM | Admit: 2019-11-03 | Discharge: 2019-11-03 | Disposition: A | Discharge status: Home / Self Care | Payer: Self-pay | Attending: Emergency Medicine | Admitting: Emergency Medicine

## 2019-11-03 DIAGNOSIS — S0990XA Unspecified injury of head, initial encounter: Secondary | ICD-10-CM | POA: Insufficient documentation

## 2019-11-03 DIAGNOSIS — Z79899 Other long term (current) drug therapy: Secondary | ICD-10-CM | POA: Insufficient documentation

## 2019-11-03 DIAGNOSIS — Y999 Unspecified external cause status: Secondary | ICD-10-CM | POA: Insufficient documentation

## 2019-11-03 DIAGNOSIS — W109XXA Fall (on) (from) unspecified stairs and steps, initial encounter: Secondary | ICD-10-CM | POA: Insufficient documentation

## 2019-11-03 DIAGNOSIS — Y929 Unspecified place or not applicable: Secondary | ICD-10-CM | POA: Insufficient documentation

## 2019-11-03 DIAGNOSIS — Y9389 Activity, other specified: Secondary | ICD-10-CM | POA: Insufficient documentation

## 2019-11-03 DIAGNOSIS — F17220 Nicotine dependence, chewing tobacco, uncomplicated: Secondary | ICD-10-CM | POA: Insufficient documentation

## 2019-11-03 DIAGNOSIS — S060X1A Concussion with loss of consciousness of 30 minutes or less, initial encounter: Secondary | ICD-10-CM | POA: Insufficient documentation

## 2019-11-03 MED ORDER — PROCHLORPERAZINE 25 MG RE SUPP
25.0000 mg | Freq: Once | RECTAL | Status: DC
  Filled 2019-11-03: qty 1

## 2019-11-03 MED ORDER — PROMETHAZINE HCL 25 MG PO TABS: 25.0000 mg | ORAL_TABLET | Freq: Four times a day (QID) | ORAL | 0 refills | Status: DC | PRN

## 2019-11-03 MED ORDER — PROCHLORPERAZINE EDISYLATE 10 MG/2ML IJ SOLN
10.0000 mg | Freq: Once | INTRAMUSCULAR | Status: AC
  Administered 2019-11-03: 10 mg via INTRAVENOUS
  Filled 2019-11-03: qty 2

## 2019-11-03 MED ORDER — KETOROLAC TROMETHAMINE 30 MG/ML IJ SOLN
30.0000 mg | Freq: Once | INTRAMUSCULAR | Status: AC
  Administered 2019-11-03: 30 mg via INTRAVENOUS
  Filled 2019-11-03: qty 1

## 2019-11-03 MED ORDER — DIPHENHYDRAMINE HCL 25 MG PO CAPS
25.0000 mg | ORAL_CAPSULE | Freq: Once | ORAL | Status: AC
  Administered 2019-11-03: 25 mg via ORAL
  Filled 2019-11-03: qty 1

## 2019-11-03 MED ORDER — BUTALBITAL-APAP-CAFFEINE 50-325-40 MG PO TABS: 1.0000 | ORAL_TABLET | Freq: Four times a day (QID) | ORAL | 0 refills | Status: AC | PRN

## 2019-11-03 MED ORDER — SODIUM CHLORIDE 0.9 % IV BOLUS
1000.0000 mL | Freq: Once | INTRAVENOUS | Status: AC
  Administered 2019-11-03: 1000 mL via INTRAVENOUS

## 2019-11-03 NOTE — ED Triage Notes (Signed)
Pt reports that on Sunday she was going up stairs in her socks and fell down about 6-7 stairs. Reports she did have some LOC. Denies being on blood thinners. Reports that since then she has had a headache esp with lights causing her to vomit. Pt was advised by PCP to come to ED for evaluation.

## 2019-11-03 NOTE — ED Provider Notes (Signed)
[facility]-EMERGENCY DEPT Provider Note   CSN: [id number] Arrival date & time: 11/03/19  1137     History Chief Complaint  Patient presents with  . Fall  . Emesis  . Headache    Frances Reed is a 29 y.o. female.  HPI Patient presents to the emergency department with headache following a fall that occurred Sunday.  Patient states she fell down about 6-7 stairs.  She is unsure if she lost consciousness.  Patient states she is having headache that is bifrontal.  She states she is also been having nausea and vomiting and light sensitivity.  Patient states that she does not have any numbness or weakness in her extremities.  The patient states she has no visual changes.  The patient denies chest pain, shortness of breath, headache,blurred vision, neck pain, fever, cough, weakness, numbness, dizziness, anorexia, edema, abdominal pain, nausea, vomiting, diarrhea, rash, back pain, dysuria, hematemesis, bloody stool, near syncope, or syncope.  Patient not take any medications prior to arrival for her symptoms.    Past Medical History:  Diagnosis Date  . Generalized anxiety disorder 06/13/2015  . History of kidney stones   . Klippel-Trenaunay-Weber syndrome   . Sleep apnea     Patient Active Problem List   Diagnosis Date Noted  . Palpitations 10/20/2019  . Precordial chest pain 09/20/2019  . Educated about COVID-19 virus infection 09/20/2019  . 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 Pain of right lower leg 10/18/2015  . Generalized anxiety disorder 06/13/2015  . Carpal tunnel syndrome 01/18/2015  . 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 FOOT SURGERY Bilateral   . LAPAROSCOPY N/A 05/21/2019   Procedure: LAPAROSCOPY DIAGNOSTIC, Left Pelvic Cystectomy ;  Surgeon: Hermina Staggers, MD;  Location: [facility];  Service: Gynecology;  Laterality: N/A;  . SKIN GRAFT Right 2000   abdominal graft for foot  .  THROAT SURGERY    . TONSILLECTOMY       OB History    Gravida  1   Para  1   Term  1   Preterm      AB      Living  1     SAB      TAB      Ectopic      Multiple      Live Births  1           No family history on file.  Social History   Tobacco Use  . Smoking status: Never Smoker  . Smokeless tobacco: Current User    Types: Chew  Substance Use Topics  . Alcohol use: Yes    Alcohol/week: 3.0 standard drinks    Types: 3 Cans of beer per week    Comment: per week  . Drug use: No    Home Medications Prior to Admission medications   Medication Sig Start Date End Date Taking? Authorizing Provider  clonazePAM (KLONOPIN) 0.5 MG tablet Take 0.5 tablets (0.25 mg total) by mouth 2 (two) times daily as needed for anxiety. 04/10/17   Ellyn Hack, MD  gabapentin (NEURONTIN) 100 MG capsule TAKE 1 CAPSULE BY MOUTH TWICE A DAY 06/24/17   Ellyn Hack, MD  PARoxetine (PAXIL) 10 MG tablet Take 10 mg by mouth daily.    [provider]    Allergies    Coconut oil and Motrin [ibuprofen]  Review of Systems   Review of  Systems All other systems negative except as documented in the HPI. All pertinent positives and negatives as reviewed in the HPI.  Physical Exam Updated Vital Signs BP 113/83 (BP Location: Right Arm)   Pulse 88   Temp 99.1 F (37.3 C) (Oral)   Resp 17   LMP 10/20/2019   SpO2 100%   Physical Exam Vitals and nursing note reviewed.  Constitutional:      General: She is in acute distress.     Appearance: She is well-developed. She is not ill-appearing, toxic-appearing or diaphoretic.  HENT:     Head: Normocephalic and atraumatic.  Eyes:     Pupils: Pupils are equal, round, and reactive to light.  Cardiovascular:     Rate and Rhythm: Normal rate and regular rhythm.     Heart sounds: Normal heart sounds. No murmur. No friction rub. No gallop.   Pulmonary:     Effort: Pulmonary effort is normal. No respiratory distress.      Breath sounds: Normal breath sounds. No wheezing.  Abdominal:     General: Bowel sounds are normal. There is no distension.     Palpations: Abdomen is soft.     Tenderness: There is no abdominal tenderness.  Musculoskeletal:     Cervical back: Normal range of motion and neck supple.  Skin:    General: Skin is warm and dry.     Capillary Refill: Capillary refill takes less than 2 seconds.     Findings: No erythema or rash.  Neurological:     Mental Status: She is alert and oriented to person, place, and time.     Cranial Nerves: No dysarthria.     Motor: No weakness or abnormal muscle tone.     Coordination: Coordination normal.  Psychiatric:        Mood and Affect: Mood normal.        Speech: Speech normal.        Behavior: Behavior normal.     ED Results / Procedures / Treatments   Labs (all labs ordered are listed, but only abnormal results are displayed) Labs Reviewed - No data to display  EKG None  Radiology CT Head Wo Contrast  Result Date: 11/03/2019 CLINICAL DATA:  Pain following fall with transient loss of consciousness EXAM: CT HEAD WITHOUT CONTRAST CT CERVICAL SPINE WITHOUT CONTRAST TECHNIQUE: Multidetector CT imaging of the head and cervical spine was performed following the standard protocol without intravenous contrast. Multiplanar CT image reconstructions of the cervical spine were also generated. COMPARISON:  Head CT April 22, 2014 FINDINGS: CT HEAD FINDINGS Brain: Ventricles and sulci are normal in size and configuration. There is no intracranial mass, hemorrhage, extra-axial fluid collection, or midline shift. The brain parenchyma appears unremarkable. No evident acute infarct. Vascular: There is no hyperdense vessel. No evident vascular calcification. Skull: The bony calvarium appears intact. Sinuses/Orbits: There is opacification throughout much of the left sphenoid sinus. Other visualized paranasal sinuses are clear. Visualized orbits appear symmetric  bilaterally. Other: Mastoid air cells are clear. CT CERVICAL SPINE FINDINGS Alignment: There is no spondylolisthesis. Skull base and vertebrae: Skull base and craniocervical junction regions appear normal. No evident acute fracture. There is well corticated lucency in the right C7 transverse process which potentially may represent residua of old trauma. No blastic or lytic bone lesions. Soft tissues and spinal canal: Prevertebral soft tissues and predental space regions are normal. No cord canal hematoma. No paraspinous lesions. Disc levels: Disc spaces appear unremarkable. No nerve root edema or effacement.  No disc extrusion or stenosis. No appreciable facet hypertrophy. Upper chest: Visualized upper lung regions are clear. Other: None IMPRESSION: CT head: Left sphenoid paranasal sinus disease. Study otherwise unremarkable. CT cervical spine: No acute fracture or spondylolisthesis. Question residua of old trauma involving the right C7 transverse process. The lucency in this area is well corticated. No appreciable arthropathy. No nerve root edema or effacement. No disc extrusion or stenosis. Electronically Signed   By: Lowella Grip III M.D.   On: 11/03/2019 13:50   CT Cervical Spine Wo Contrast  Result Date: 11/03/2019 CLINICAL DATA:  Pain following fall with transient loss of consciousness EXAM: CT HEAD WITHOUT CONTRAST CT CERVICAL SPINE WITHOUT CONTRAST TECHNIQUE: Multidetector CT imaging of the head and cervical spine was performed following the standard protocol without intravenous contrast. Multiplanar CT image reconstructions of the cervical spine were also generated. COMPARISON:  Head CT April 22, 2014 FINDINGS: CT HEAD FINDINGS Brain: Ventricles and sulci are normal in size and configuration. There is no intracranial mass, hemorrhage, extra-axial fluid collection, or midline shift. The brain parenchyma appears unremarkable. No evident acute infarct. Vascular: There is no hyperdense vessel. No  evident vascular calcification. Skull: The bony calvarium appears intact. Sinuses/Orbits: There is opacification throughout much of the left sphenoid sinus. Other visualized paranasal sinuses are clear. Visualized orbits appear symmetric bilaterally. Other: Mastoid air cells are clear. CT CERVICAL SPINE FINDINGS Alignment: There is no spondylolisthesis. Skull base and vertebrae: Skull base and craniocervical junction regions appear normal. No evident acute fracture. There is well corticated lucency in the right C7 transverse process which potentially may represent residua of old trauma. No blastic or lytic bone lesions. Soft tissues and spinal canal: Prevertebral soft tissues and predental space regions are normal. No cord canal hematoma. No paraspinous lesions. Disc levels: Disc spaces appear unremarkable. No nerve root edema or effacement. No disc extrusion or stenosis. No appreciable facet hypertrophy. Upper chest: Visualized upper lung regions are clear. Other: None IMPRESSION: CT head: Left sphenoid paranasal sinus disease. Study otherwise unremarkable. CT cervical spine: No acute fracture or spondylolisthesis. Question residua of old trauma involving the right C7 transverse process. The lucency in this area is well corticated. No appreciable arthropathy. No nerve root edema or effacement. No disc extrusion or stenosis. Electronically Signed   By: Lowella Grip III M.D.   On: 11/03/2019 13:50    Procedures Procedures (including critical care time)  Medications Ordered in ED Medications  ketorolac (TORADOL) 30 MG/ML injection 30 mg (has no administration in time range)  prochlorperazine (COMPAZINE) suppository 25 mg (has no administration in time range)  diphenhydrAMINE (BENADRYL) capsule 25 mg (has no administration in time range)  sodium chloride 0.9 % bolus 1,000 mL (1,000 mLs Intravenous New Bag/Given (Non-Interop) 11/03/19 1231)    ED Course  I have reviewed the triage vital signs and the  nursing notes.  Pertinent labs & imaging results that were available during my care of the patient were reviewed by me and considered in my medical decision making (see chart for details).    MDM Rules/Calculators/A&P                      Patient mostly has a concussion.  Will treat with IV fluids for the vomiting and nausea.  Also will give her a migraine cocktail she does have a migraine type headache due to the light sensitivity.  Patient patient is advised of the plan and all questions were answered. Final Clinical Impression(s) /  ED Diagnoses Final diagnoses:  None    Rx / DC Orders ED Discharge Orders    None       Charlestine Night, PA-C 11/03/19 1427    Little, Ambrose Finland, MD 11/06/19 [phone]

## 2019-11-03 NOTE — Discharge Instructions (Signed)
Return here as needed. Follow up with your primary doctor. Increase your fluid intake. °

## 2019-11-15 ENCOUNTER — Encounter: Payer: Self-pay | Admitting: Cardiology

## 2019-12-03 NOTE — Telephone Encounter (Signed)
[employer] has not received monitor back from patient. They have attempted to contact the patient on 3 occasions to request monitor be returned to [employer].  CANCELLED ORDER 

## 2020-01-06 ENCOUNTER — Encounter: Payer: Self-pay | Admitting: Emergency Medicine

## 2020-01-06 ENCOUNTER — Ambulatory Visit
Admission: EM | Admit: 2020-01-06 | Discharge: 2020-01-06 | Disposition: A | Discharge status: Home / Self Care | Payer: Medicaid Other

## 2020-01-06 ENCOUNTER — Ambulatory Visit (INDEPENDENT_AMBULATORY_CARE_PROVIDER_SITE_OTHER): Payer: Medicaid Other

## 2020-01-06 ENCOUNTER — Other Ambulatory Visit: Payer: Self-pay

## 2020-01-06 DIAGNOSIS — M25511 Pain in right shoulder: Secondary | ICD-10-CM

## 2020-01-06 DIAGNOSIS — W19XXXA Unspecified fall, initial encounter: Secondary | ICD-10-CM

## 2020-01-06 MED ORDER — KETOROLAC TROMETHAMINE 60 MG/2ML IM SOLN
60.0000 mg | Freq: Once | INTRAMUSCULAR | Status: AC
  Administered 2020-01-06: 60 mg via INTRAMUSCULAR

## 2020-01-06 MED ORDER — CYCLOBENZAPRINE HCL 10 MG PO TABS: 10.0000 mg | ORAL_TABLET | Freq: Two times a day (BID) | ORAL | 0 refills | Status: DC | PRN

## 2020-01-06 MED ORDER — TRAMADOL HCL 50 MG PO TABS: 50.0000 mg | ORAL_TABLET | Freq: Four times a day (QID) | ORAL | 0 refills | Status: DC | PRN

## 2020-01-06 MED ORDER — NAPROXEN 500 MG PO TABS: 500.0000 mg | ORAL_TABLET | Freq: Two times a day (BID) | ORAL | 0 refills | Status: DC

## 2020-01-06 NOTE — ED Triage Notes (Signed)
Patient tripped and fell.  Landing on right hand that was outstretched, but heard pop in right  shoulder.  Pain in shoulder, right radial pulse 2 +, brisk cap refill to nail beds of right fingers.  Patient is able to move fingers.  Feels pain when moving elbow away from side.

## 2020-01-06 NOTE — Discharge Instructions (Signed)
Take the prescribed naproxen as needed for your pain.  Take the muscle relaxer Flexeril as needed for muscle spasm; do not drive, operate machinery, or drink alcohol with this medication as it may make you drowsy.    Follow up with an orthopedist if your pain is not improving.  Go to the emergency department if you have worsening pain or develop new symptoms such as difficulty with urination, weakness, numbness, loss of control of your bladder or bowels, fever, chills or other concerns.

## 2020-01-06 NOTE — ED Provider Notes (Signed)
[facility]   [id number] 01/06/20 Arrival Time: 1657  FY:BOFBP PAIN  SUBJECTIVE: History from: patient. Frances Reed is a 29 y.o. female complains of right shoulder pain that began earlier today. [place] that [place] and [place] concrete with her son. Reports that she heard and felt her R shoulder pop and move. Denies at home treatments. Reports that the zoo rangers made her a sling out of cloth and her arm has been in this for the last 3 hours. Describes the pain as constant and sharp/achy in character. Symptoms are made worse with activity. Reports that she Denies similar symptoms in the past. Denies fever, chills, erythema, ecchymosis, effusion, weakness, numbness and tingling, saddle paresthesias, loss of bowel or bladder function.      ROS: As per HPI.  All other pertinent ROS negative.     Past Medical History:  Diagnosis Date  . Generalized anxiety disorder 06/13/2015  . History of kidney stones   . Klippel-Trenaunay-Weber syndrome   . Sleep apnea    Past Surgical History:  Procedure Laterality Date  . FOOT SURGERY Bilateral   . LAPAROSCOPY N/A 05/21/2019   Procedure: LAPAROSCOPY DIAGNOSTIC, Left Pelvic Cystectomy ;  Surgeon: Hermina Staggers, MD;  Location: [facility] OR;  Service: Gynecology;  Laterality: N/A;  . SKIN GRAFT Right 2000   abdominal graft for foot  . THROAT SURGERY    . TONSILLECTOMY     Allergies  Allergen Reactions  . Coconut Oil Anaphylaxis  . Motrin [Ibuprofen] Other (See Comments)    Klippel Trenauney Syndrome "Doesn't like to take it"   No current facility-administered medications on file prior to encounter.   Current Outpatient Medications on File Prior to Encounter  Medication Sig Dispense Refill  . Levothyroxine Sodium (SYNTHROID PO) Take by mouth.    . butalbital-acetaminophen-caffeine (FIORICET) 50-325-40 MG tablet Take 1 tablet by mouth every 6 (six) hours as needed for headache. 20 tablet 0  . clonazePAM (KLONOPIN)  0.5 MG tablet Take 0.5 tablets (0.25 mg total) by mouth 2 (two) times daily as needed for anxiety. 60 tablet 2  . gabapentin (NEURONTIN) 100 MG capsule TAKE 1 CAPSULE BY MOUTH TWICE A DAY 180 capsule 0  . PARoxetine (PAXIL) 10 MG tablet Take 10 mg by mouth daily.    . promethazine (PHENERGAN) 25 MG tablet Take 1 tablet (25 mg total) by mouth every 6 (six) hours as needed for nausea or vomiting. 10 tablet 0   Social History   Socioeconomic History  . Marital status: Single    Spouse name: Not on file  . Number of children: Not on file  . Years of education: Not on file  . Highest education level: Not on file  Occupational History  . Not on file  Tobacco Use  . Smoking status: Never Smoker  . Smokeless tobacco: Current User    Types: Chew  Substance and Sexual Activity  . Alcohol use: Yes    Alcohol/week: 3.0 standard drinks    Types: 3 Cans of beer per week    Comment: per week  . Drug use: No  . Sexual activity: Yes    Partners: Female    Birth control/protection: None  Other Topics Concern  . Not on file  Social History Narrative  . Not on file   Social Determinants of Health   Financial Resource Strain:   . Difficulty of Paying Living Expenses:   Food Insecurity:   . Worried About [employer] of  Food in the Last Year:   . Ran Out of Food in the Last Year:   Transportation Needs:   . Freight forwarder (Medical):   Marland Kitchen Lack of Transportation (Non-Medical):   Physical Activity:   . Days of Exercise per Week:   . Minutes of Exercise per Session:   Stress:   . Feeling of Stress :   Social Connections:   . Frequency of Communication with Friends and Family:   . Frequency of Social Gatherings with Friends and Family:   . Attends Religious Services:   . Active Member of Clubs or Organizations:   . Attends Banker Meetings:   Marland Kitchen Marital Status:   Intimate Partner Violence:   . Fear of Current or Ex-Partner:   . Emotionally Abused:   Marland Kitchen Physically  Abused:   . Sexually Abused:    History reviewed. No pertinent family history.  OBJECTIVE:  Vitals:   01/06/20 1711  BP: 102/67  Pulse: 80  Resp: 20  Temp: 97.9 F (36.6 C)  TempSrc: Oral  SpO2: 99%    General appearance: ALERT; in no acute distress.  Head: NCAT Lungs: Normal respiratory effort CV: Radial pulses 2+ bilaterally. Cap refill < 2 seconds Musculoskeletal:  Inspection: Skin warm, dry, clear and intact without obvious erythema, effusion, or ecchymosis.  Palpation:Tender to palpation at anterior aspect of R shoulder ROM: FROM active and passive, limited ROM active and passive in R shoulder Strength: R 4/5, L 5/5 shld abduction, R 4/5, L 5/5 shld adduction, 5/5 elbow flexion, 5/5 elbow extension, 5/5 grip strength,  Stability: Anterior/ posterior drawer intact Skin: warm and dry Neurologic: Ambulates without difficulty; Sensation intact about the upper/ lower extremities Psychological: alert and cooperative; normal mood and affect  DIAGNOSTIC STUDIES:  DG Shoulder Right  Result Date: 01/06/2020 CLINICAL DATA:  Recent fall with right shoulder pain and decreased range of motion, initial encounter EXAM: RIGHT SHOULDER - 2+ VIEW COMPARISON:  None. FINDINGS: No acute fracture or dislocation is noted. No soft tissue abnormality is seen. The underlying bony thorax appears within normal limits. IMPRESSION: No acute abnormality noted. Electronically Signed   By: Alcide Clever M.D.   On: 01/06/2020 18:15     ASSESSMENT & PLAN:  1. Pain in joint of right shoulder   2. Accident due to mechanical fall without injury, initial encounter       Meds ordered this encounter  Medications  . ketorolac (TORADOL) injection 60 mg  . naproxen (NAPROSYN) 500 MG tablet    Sig: Take 1 tablet (500 mg total) by mouth 2 (two) times daily.    Dispense:  30 tablet    Refill:  0    Order Specific Question:   Supervising Provider    Answer:   Merrilee Jansky [id number]  . cyclobenzaprine  (FLEXERIL) 10 MG tablet    Sig: Take 1 tablet (10 mg total) by mouth 2 (two) times daily as needed for muscle spasms.    Dispense:  20 tablet    Refill:  0    Order Specific Question:   Supervising Provider    Answer:   Merrilee Jansky [id number]  . traMADol (ULTRAM) 50 MG tablet    Sig: Take 1 tablet (50 mg total) by mouth every 6 (six) hours as needed.    Dispense:  15 tablet    Refill:  0    Order Specific Question:   Supervising Provider    Answer:   Merrilee Jansky [id number]  Xray of R shoulder negative. Received Toradol in office today Prescribed naproxen. Take as directed. Prescribed Flexeril. Take as directed. Prescribed Tramadol. Take as directed. Continue conservative management of rest, ice, and gentle stretches Take naproxen as needed for pain relief (may cause abdominal discomfort, ulcers, and GI bleeds avoid taking with other NSAIDs) Take cyclobenzaprine at nighttime for symptomatic relief. Avoid driving or operating heavy machinery while using medication. Follow up with PCP if symptoms persist Return or go to the ER if you have any new or worsening symptoms (fever, chills, chest pain, abdominal pain, changes in bowel or bladder habits, pain radiating into lower legs.   Reviewed expectations re: course of current medical issues. Questions answered. Outlined signs and symptoms indicating need for more acute intervention. Patient verbalized understanding. After Visit Summary given.       Faustino Congress, NP 01/06/20 [phone]

## 2020-02-16 ENCOUNTER — Emergency Department (HOSPITAL_COMMUNITY): Payer: Self-pay

## 2020-02-16 ENCOUNTER — Emergency Department (HOSPITAL_COMMUNITY)
Admission: EM | Admit: 2020-02-16 | Discharge: 2020-02-16 | Disposition: A | Discharge status: Home / Self Care | Payer: Self-pay | Attending: Emergency Medicine | Admitting: Emergency Medicine

## 2020-02-16 DIAGNOSIS — F17220 Nicotine dependence, chewing tobacco, uncomplicated: Secondary | ICD-10-CM | POA: Insufficient documentation

## 2020-02-16 DIAGNOSIS — Y92009 Unspecified place in unspecified non-institutional (private) residence as the place of occurrence of the external cause: Secondary | ICD-10-CM | POA: Insufficient documentation

## 2020-02-16 DIAGNOSIS — Z79899 Other long term (current) drug therapy: Secondary | ICD-10-CM | POA: Insufficient documentation

## 2020-02-16 DIAGNOSIS — S0990XA Unspecified injury of head, initial encounter: Secondary | ICD-10-CM | POA: Insufficient documentation

## 2020-02-16 DIAGNOSIS — Y939 Activity, unspecified: Secondary | ICD-10-CM | POA: Insufficient documentation

## 2020-02-16 DIAGNOSIS — M542 Cervicalgia: Secondary | ICD-10-CM | POA: Insufficient documentation

## 2020-02-16 DIAGNOSIS — W19XXXA Unspecified fall, initial encounter: Secondary | ICD-10-CM

## 2020-02-16 DIAGNOSIS — W109XXA Fall (on) (from) unspecified stairs and steps, initial encounter: Secondary | ICD-10-CM | POA: Insufficient documentation

## 2020-02-16 DIAGNOSIS — F10920 Alcohol use, unspecified with intoxication, uncomplicated: Secondary | ICD-10-CM | POA: Insufficient documentation

## 2020-02-16 DIAGNOSIS — E041 Nontoxic single thyroid nodule: Secondary | ICD-10-CM | POA: Insufficient documentation

## 2020-02-16 DIAGNOSIS — Q872 Congenital malformation syndromes predominantly involving limbs: Secondary | ICD-10-CM | POA: Insufficient documentation

## 2020-02-16 DIAGNOSIS — Y999 Unspecified external cause status: Secondary | ICD-10-CM | POA: Insufficient documentation

## 2020-02-16 LAB — ETHANOL: Alcohol, Ethyl (B): 105 mg/dL — ABNORMAL HIGH (ref <10)

## 2020-02-16 LAB — RAPID URINE DRUG SCREEN, HOSP PERFORMED
Amphetamines: NOT DETECTED
Barbiturates: POSITIVE — AB
Benzodiazepines: NOT DETECTED
Cocaine: NOT DETECTED
Opiates: NOT DETECTED
Tetrahydrocannabinol: NOT DETECTED

## 2020-02-16 LAB — CBG MONITORING, ED: Glucose-Capillary: 200 mg/dL — ABNORMAL HIGH (ref 70–99)

## 2020-02-16 LAB — PREGNANCY, URINE: Preg Test, Ur: NEGATIVE

## 2020-02-16 MED ORDER — LORAZEPAM 1 MG PO TABS
1.0000 mg | ORAL_TABLET | Freq: Once | ORAL | Status: AC
  Administered 2020-02-16: 1 mg via ORAL
  Filled 2020-02-16: qty 1

## 2020-02-16 MED ORDER — LORAZEPAM 2 MG/ML IJ SOLN
0.5000 mg | Freq: Once | INTRAMUSCULAR | Status: AC
  Administered 2020-02-16: 0.5 mg via INTRAVENOUS
  Filled 2020-02-16: qty 1

## 2020-02-16 NOTE — ED Notes (Signed)
Pt very anxious, pt's wife at bedside. Hx of TBI and PTSD

## 2020-02-16 NOTE — ED Notes (Signed)
Spoke with Em[facility] from [place] regarding negative pregnancy and estimated time for pt scan, Xray sending transport for pt now. Provider and pt made aware at this time.

## 2020-02-16 NOTE — ED Notes (Signed)
Patient transported to X-ray 
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
## 2020-02-16 NOTE — ED Triage Notes (Signed)
Pt transported from home by [facility] after fall down 8-10 carpeted steps, hitting head. Brief unresponsive episode per wife. C/o pain to back of head/neck and back.  +etoh, Fentanyl , IVest.

## 2020-02-16 NOTE — Discharge Instructions (Addendum)
You were seen today after a fall.  Your imaging shows shows no acute fracture.  Follow-up with your primary doctor.  You need to be careful drinking alcohol as this seemed to precipitate your fall.  You were incidentally found to have a thyroid nodule.  You need to follow-up with thyroid ultrasound.

## 2020-02-16 NOTE — ED Notes (Signed)
Delay in xray d/t no preg test result. ccollar removed, ok per Dr. Wilkie Aye.  Pt will attempt to give urine on bedside commode.

## 2020-02-16 NOTE — ED Notes (Signed)
Pt discharge instructions reviewed with pt. Pt verbalized understanding of instructions. Pt discharged. 

## 2020-02-16 NOTE — ED Notes (Signed)
Pt A&Ox4 responding appropriately to commands, pt appears anxious but able to express all needs wants and concerns without difficulty. Pt refers female staff in room, pt spouse requesting all female staff stating " It will really help with her anxiety and PTSD."
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
## 2020-02-16 NOTE — ED Notes (Signed)
Pt requesting something for anxiety pending Xray.

## 2020-02-16 NOTE — ED Notes (Signed)
Pt A&Ox4 at this time, able to transfer x1 assist of SO to bedside commode. Pt reports headache and body aches at this time, pt denies dizziness, lightheadedness, nausea or vomiting.

## 2020-09-17 ENCOUNTER — Other Ambulatory Visit: Payer: Self-pay

## 2020-09-17 ENCOUNTER — Ambulatory Visit
Admission: EM | Admit: 2020-09-17 | Discharge: 2020-09-17 | Disposition: A | Discharge status: Home / Self Care | Payer: Self-pay | Attending: Urgent Care | Admitting: Urgent Care

## 2020-09-17 ENCOUNTER — Ambulatory Visit (INDEPENDENT_AMBULATORY_CARE_PROVIDER_SITE_OTHER): Payer: Self-pay

## 2020-09-17 ENCOUNTER — Encounter: Payer: Self-pay | Admitting: Emergency Medicine

## 2020-09-17 DIAGNOSIS — M25571 Pain in right ankle and joints of right foot: Secondary | ICD-10-CM

## 2020-09-17 DIAGNOSIS — M79672 Pain in left foot: Secondary | ICD-10-CM

## 2020-09-17 DIAGNOSIS — M722 Plantar fascial fibromatosis: Secondary | ICD-10-CM

## 2020-09-17 MED ORDER — PREDNISONE 20 MG PO TABS: ORAL_TABLET | ORAL | 0 refills | Status: DC

## 2020-09-17 NOTE — ED Provider Notes (Signed)
[facility]   MRN: [id number] DOB: [birth year]  Subjective:   Frances Reed is a 30 y.o. female presenting for 4-day history of acute onset persistent and worsening left foot pain over her arch.  Denies fall, trauma, redness, wound.  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  She also has a history of Klippel-Trenaunay-Weber syndrome.  Has been using naproxen and Aleve for pain relief.  No current facility-administered medications for this encounter.  Current Outpatient Medications:  .  butalbital-acetaminophen-caffeine (FIORICET) 50-325-40 MG tablet, Take 1 tablet by mouth every 6 (six) hours as needed for headache., Disp: 20 tablet, Rfl: 0 .  clonazePAM (KLONOPIN) 0.5 MG tablet, Take 0.5 tablets (0.25 mg total) by mouth 2 (two) times daily as needed for anxiety. (Patient not taking: Reported on 09/17/2020), Disp: 60 tablet, Rfl: 2 .  cyclobenzaprine (FLEXERIL) 10 MG tablet, Take 1 tablet (10 mg total) by mouth 2 (two) times daily as needed for muscle spasms. (Patient not taking: Reported on 09/17/2020), Disp: 20 tablet, Rfl: 0 .  gabapentin (NEURONTIN) 100 MG capsule, TAKE 1 CAPSULE BY MOUTH TWICE A DAY, Disp: 180 capsule, Rfl: 0 .  gabapentin (NEURONTIN) 100 MG capsule, Take 100 mg by mouth 2 (two) times daily., Disp: , Rfl:  .  Levothyroxine Sodium (SYNTHROID PO), Take by mouth., Disp: , Rfl:  .  naproxen (NAPROSYN) 500 MG tablet, Take 1 tablet (500 mg total) by mouth 2 (two) times daily., Disp: 30 tablet, Rfl: 0 .  PARoxetine (PAXIL) 10 MG tablet, Take 10 mg by mouth daily. (Patient not taking: Reported on 09/17/2020), Disp: , Rfl:  .  PARoxetine (PAXIL) 10 MG tablet, Take 10 mg by mouth daily. (Patient not taking: Reported on 09/17/2020), Disp: , Rfl:  .  promethazine (PHENERGAN) 25 MG tablet, Take 1 tablet (25 mg total) by mouth every 6 (six) hours as needed for nausea or vomiting., Disp: 10 tablet, Rfl: 0 .  traMADol (ULTRAM) 50 MG tablet, Take 1  tablet (50 mg total) by mouth every 6 (six) hours as needed. (Patient not taking: Reported on 09/17/2020), Disp: 15 tablet, Rfl: 0   Allergies  Allergen Reactions  . Coconut Oil Anaphylaxis  . Motrin [Ibuprofen] Other (See Comments)    Klippel Trenauney Syndrome "Doesn't like to take it"    Past Medical History:  Diagnosis Date  . Generalized anxiety disorder 06/13/2015  . History of kidney stones   . Klippel-Trenaunay-Weber syndrome   . Sleep apnea      Past Surgical History:  Procedure Laterality Date  . FOOT SURGERY Bilateral   . LAPAROSCOPY N/A 05/21/2019   Procedure: LAPAROSCOPY DIAGNOSTIC, Left Pelvic Cystectomy ;  Surgeon: Hermina Staggers, MD;  Location: [facility] OR;  Service: Gynecology;  Laterality: N/A;  . SKIN GRAFT Right 2000   abdominal graft for foot  . THROAT SURGERY    . TONSILLECTOMY      History reviewed. No pertinent family history.  Social History   Tobacco Use  . Smoking status: Never Smoker  . Smokeless tobacco: Current User    Types: Chew  Vaping Use  . Vaping Use: Never used  Substance Use Topics  . Alcohol use: Yes    Alcohol/week: 3.0 standard drinks    Types: 3 Cans of beer per week    Comment: per week  . Drug use: No    ROS   Objective:   Vitals: BP 118/73 (BP Location: Right Arm)   Pulse 75   Temp  98.2 F (36.8 C) (Oral)   Resp 18   SpO2 98%   Physical Exam Constitutional:      General: She is not in acute distress.    Appearance: Normal appearance. She is well-developed. She is not ill-appearing, toxic-appearing or diaphoretic.  HENT:     Head: Normocephalic and atraumatic.     Nose: Nose normal.     Mouth/Throat:     Mouth: Mucous membranes are moist.     Pharynx: Oropharynx is clear.  Eyes:     General: No scleral icterus.       Right eye: No discharge.        Left eye: No discharge.     Extraocular Movements: Extraocular movements intact.     Conjunctiva/sclera: Conjunctivae normal.     Pupils: Pupils are equal,  round, and reactive to light.  Cardiovascular:     Rate and Rhythm: Normal rate.  Pulmonary:     Effort: Pulmonary effort is normal.  Musculoskeletal:       Feet:  Skin:    General: Skin is warm and dry.  Neurological:     General: No focal deficit present.     Mental Status: She is alert and oriented to person, place, and time.  Psychiatric:        Mood and Affect: Mood normal.        Behavior: Behavior normal.        Thought Content: Thought content normal.        Judgment: Judgment normal.     Left foot - Negative.  Overread is pending.   Assessment and Plan :   PDMP not reviewed this encounter.  1. Plantar fasciitis of left foot   2. Left foot pain     Patient would like aggressive management, offered prednisone course.  Follow-up with podiatry.  General management for plantar fasciitis reviewed. Counseled patient on potential for adverse effects with medications prescribed/recommended today, ER and return-to-clinic precautions discussed, patient verbalized understanding.  We will notify patient of any abnormal results from the radiology overread.    Wallis Bamberg, PA-C 09/17/20 1406

## 2020-09-17 NOTE — ED Triage Notes (Signed)
Pt here for left foot pain in arch area x 4 days; denies any injury

## 2020-09-17 NOTE — Discharge Instructions (Addendum)
[facility] ([place]) Podiatrist in [place], [place] COVID-19 info: [email or web address] Get online care: [email or web address] Address: [address], [place], [place] [address] Phone: [phone] Appointments: [email or web address]   [facility], [place], [place] Doctor in [place], [place] Address: [address], ,  [address] Phone: [phone]  

## 2020-12-08 IMAGING — US US TRANSVAGINAL NON-OB
2 series · 13 of 25 positions shown · non-contrast
Comparison: Ultrasound 04/04/2017.

CLINICAL DATA: Left adnexal pain.  Left ovarian cyst.

EXAM:
TRANSABDOMINAL AND TRANSVAGINAL ULTRASOUND OF PELVIS
DOPPLER ULTRASOUND OF OVARIES
TECHNIQUE: Both transabdominal and transvaginal ultrasound examinations of the
pelvis were performed. Transabdominal technique was performed for
global imaging of the pelvis including uterus, ovaries, adnexal
regions, and pelvic cul-de-sac.
It was necessary to proceed with endovaginal exam following the
transabdominal exam to visualize the uterus, ovaries, adnexa. Color
and duplex Doppler ultrasound was utilized to evaluate blood flow to
the ovaries.

[Series 1: us transvaginal non-ob · 12 of 124 slices shown (1 of 2)]
[im 1/124]
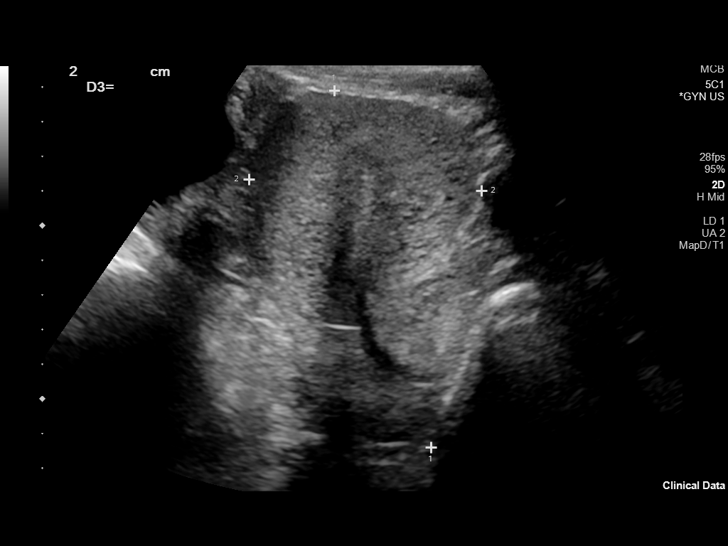
[im 11/124]
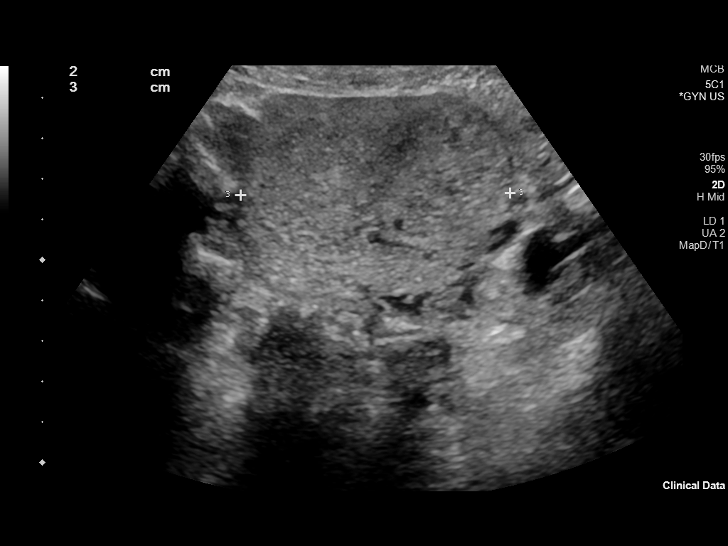
[im 22/124]
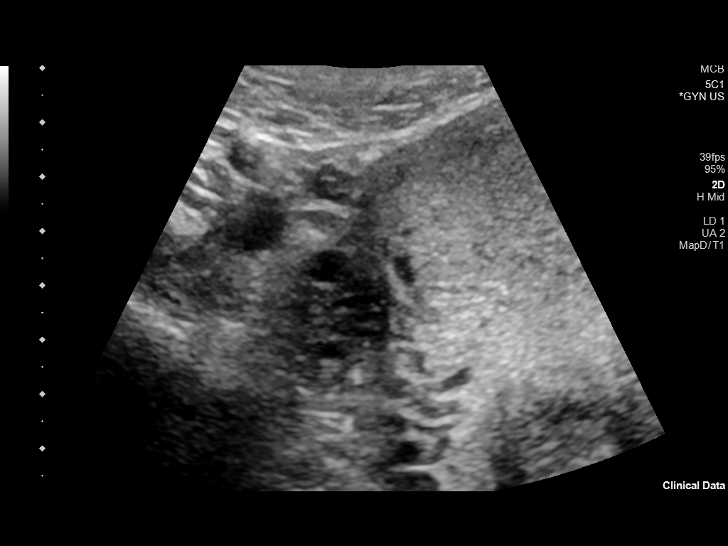
[im 33/124]
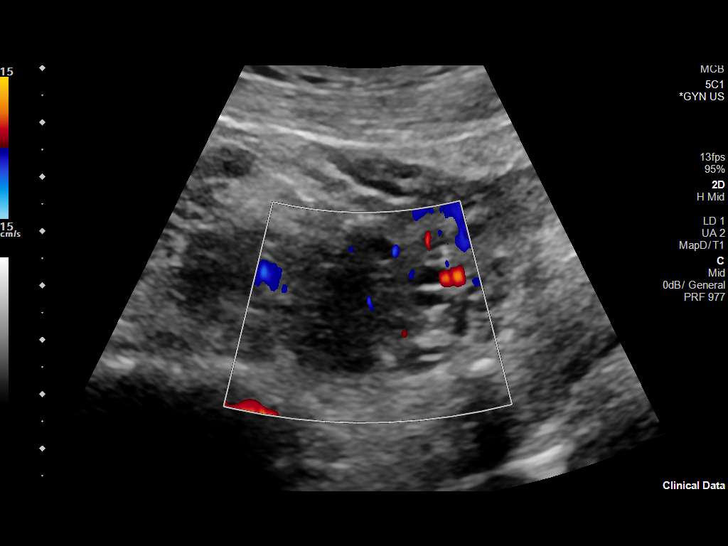
[im 43/124]
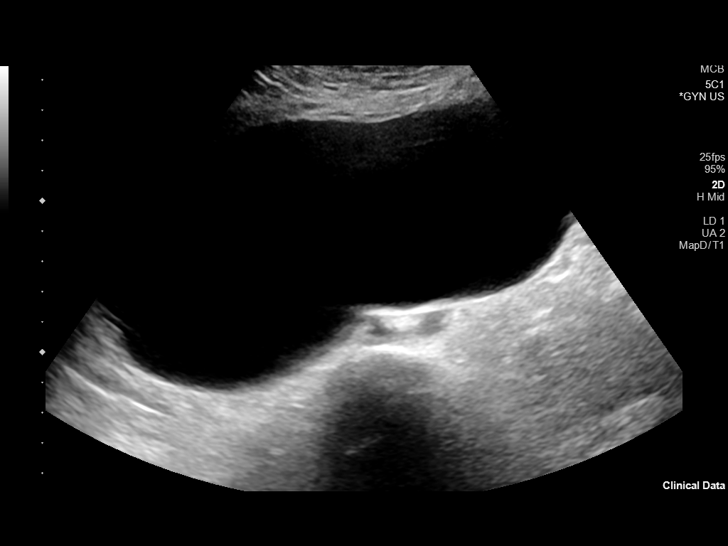
[im 54/124]
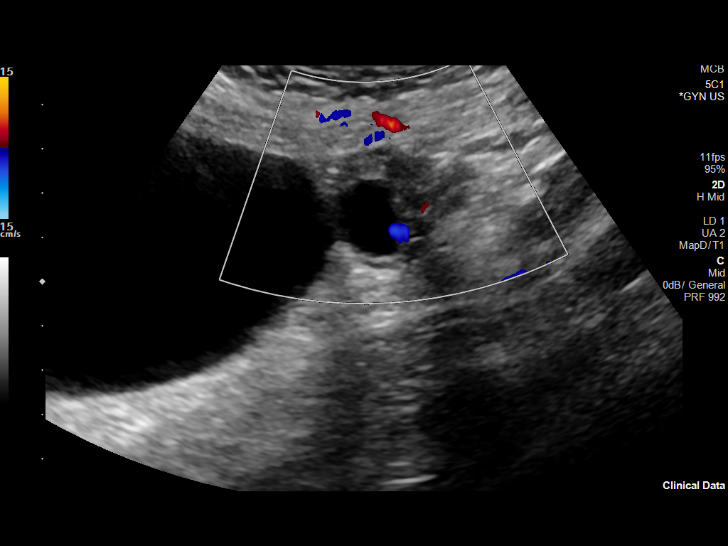
[im 65/124]
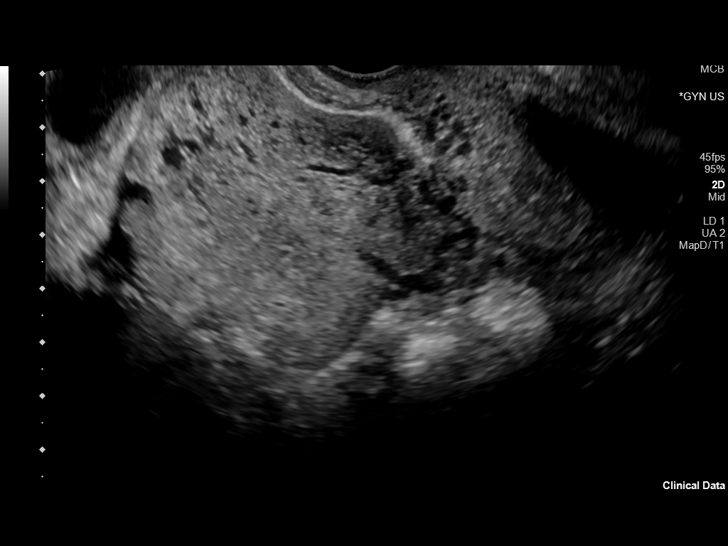
[im 75/124]
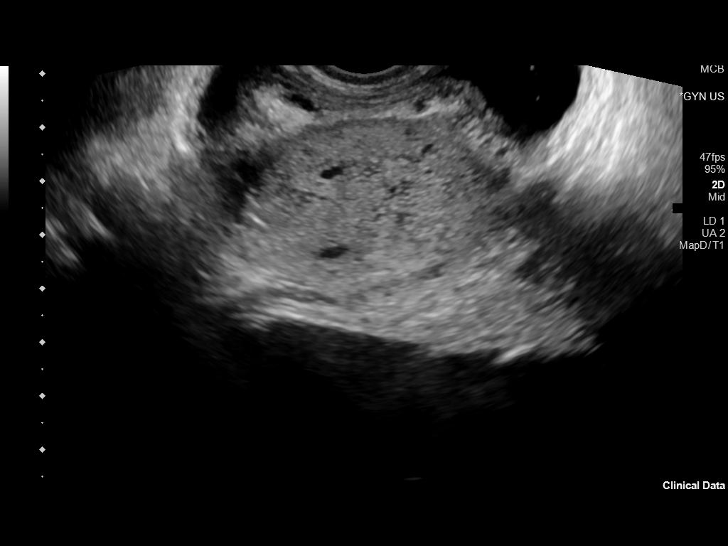
[im 86/124]
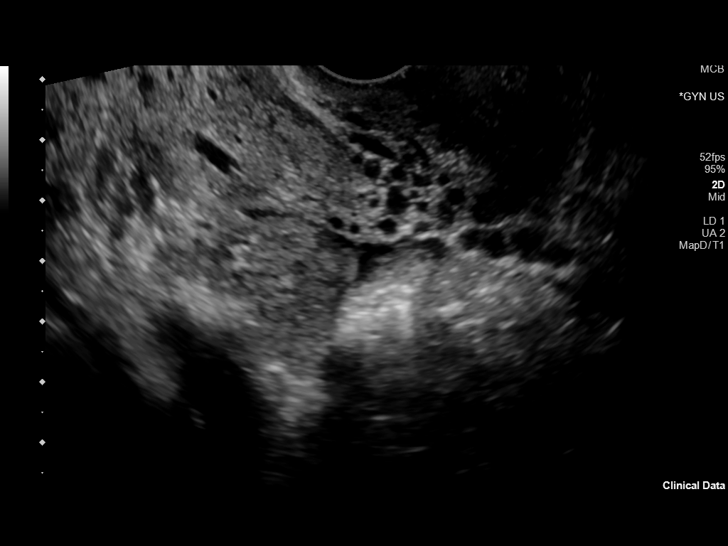
[im 97/124]
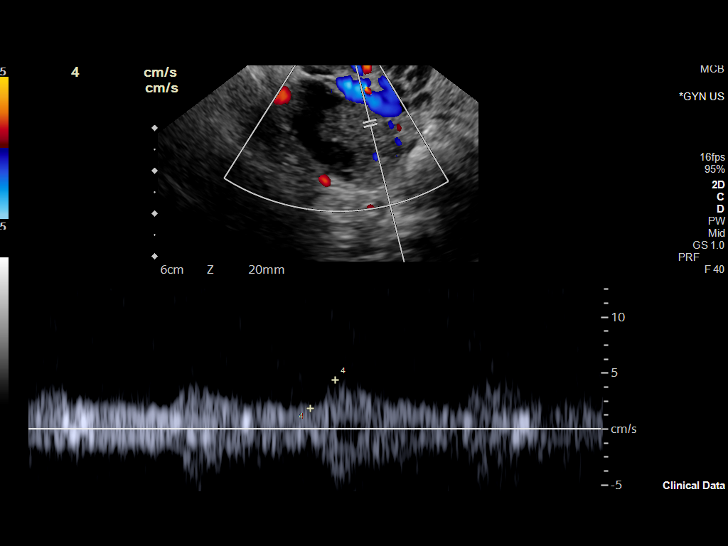
[im 107/124]
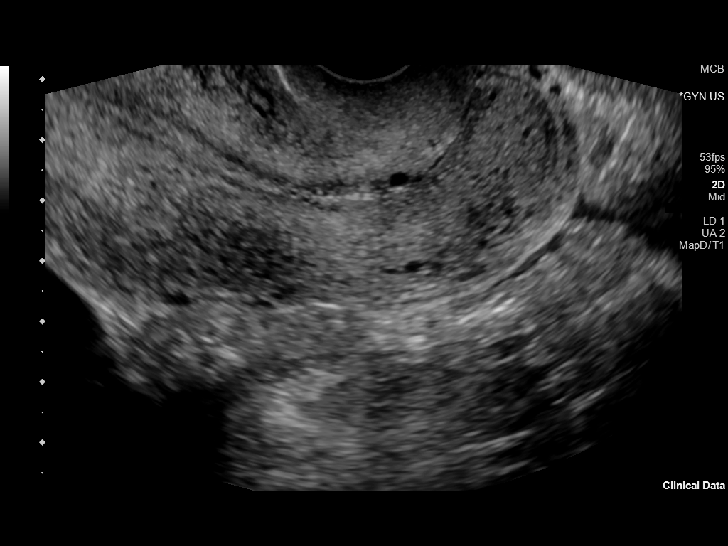
[im 118/124]
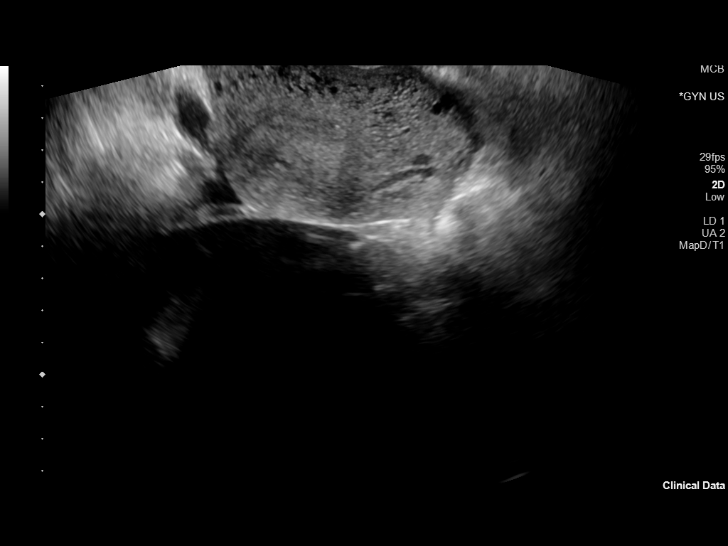

[Series 2: us transvaginal non-ob · 1 of 2 slices shown (2 of 2)]
[im 1/2]
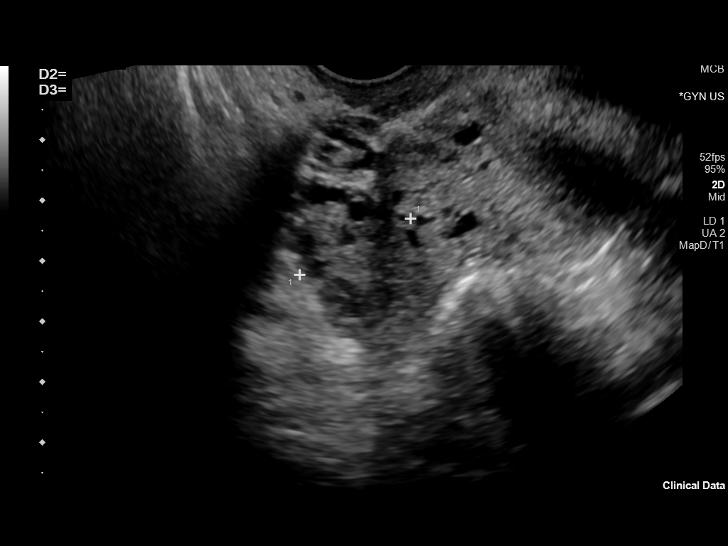

[13 of 25 positions shown; findings below may reference images not displayed]

FINDINGS: Uterus

Measurements: 10.7 x 6.7 x 6.7 cm = volume: 249.7 ML. No fibroids or
other mass visualized.

Endometrium

Thickness: 6.6.  No focal abnormality visualized.

Right ovary

Measurements: 3.8 x 1.9 x 2.1 cm = volume: 7.7 mL. Normal
appearance/no adnexal mass.

Left ovary

Measurements: 3.5 x 3.4 x 7.8 cm = volume: 48.8 mL. 16.0 x 13.1 x
7.2 cm cyst noted on the left ovary. Although the cyst appears to be
simple has increased in size from prior exam and cystic ovarian
malignancy cannot be completely excluded.

Pulsed Doppler evaluation of both ovaries demonstrates normal
low-resistance arterial and venous waveforms.

Other findings

Trace free pelvic fluid.
IMPRESSION: 1. Interval growth of large left ovarian cyst with a cyst now
measuring 6.0 x 13.1 x 7.2 cm. Although the cysts appears to be
simple, given interval growth cystic ovarian malignancy cannot be
completely excluded. Gynecologic consultation is suggested.

2.  No evidence of ovarian torsion.  Trace free pelvic fluid.

## 2020-12-08 IMAGING — US US PELVIS COMPLETE
2 series · 13 of 25 positions shown · non-contrast
Comparison: Ultrasound 04/04/2017.

CLINICAL DATA: Left adnexal pain.  Left ovarian cyst.

EXAM:
TRANSABDOMINAL AND TRANSVAGINAL ULTRASOUND OF PELVIS
DOPPLER ULTRASOUND OF OVARIES
TECHNIQUE: Both transabdominal and transvaginal ultrasound examinations of the
pelvis were performed. Transabdominal technique was performed for
global imaging of the pelvis including uterus, ovaries, adnexal
regions, and pelvic cul-de-sac.
It was necessary to proceed with endovaginal exam following the
transabdominal exam to visualize the uterus, ovaries, adnexa. Color
and duplex Doppler ultrasound was utilized to evaluate blood flow to
the ovaries.

[Series 1: us pelvis complete · 12 of 124 slices shown (1 of 2)]
[im 1/124]
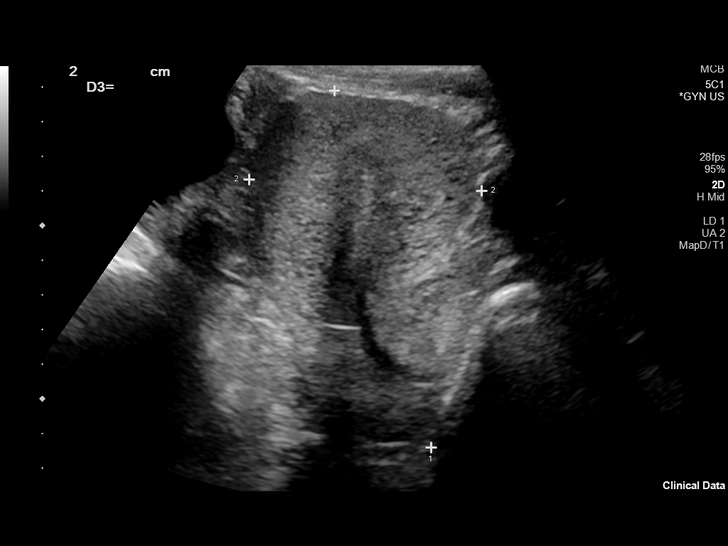
[im 11/124]
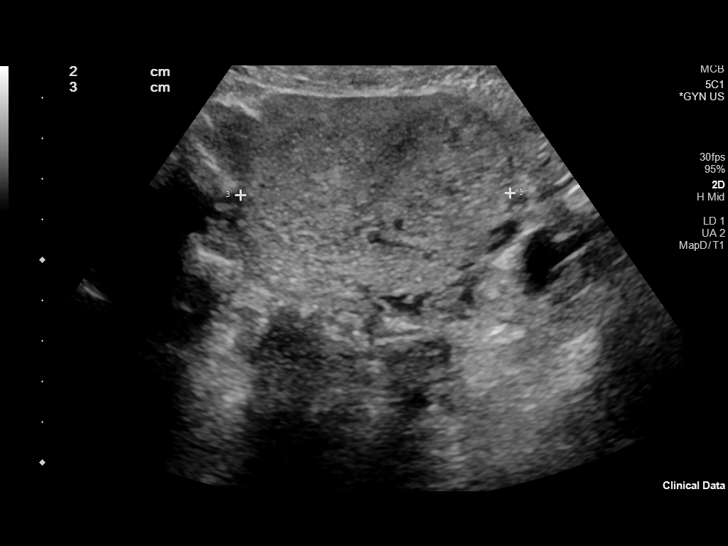
[im 22/124]
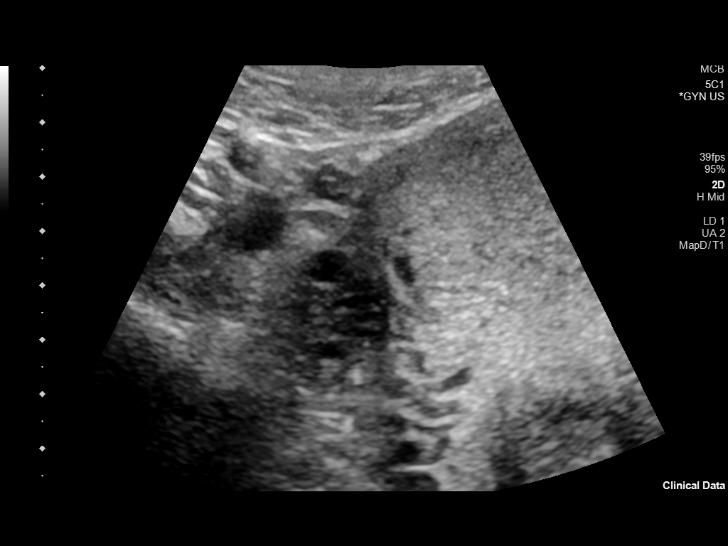
[im 33/124]
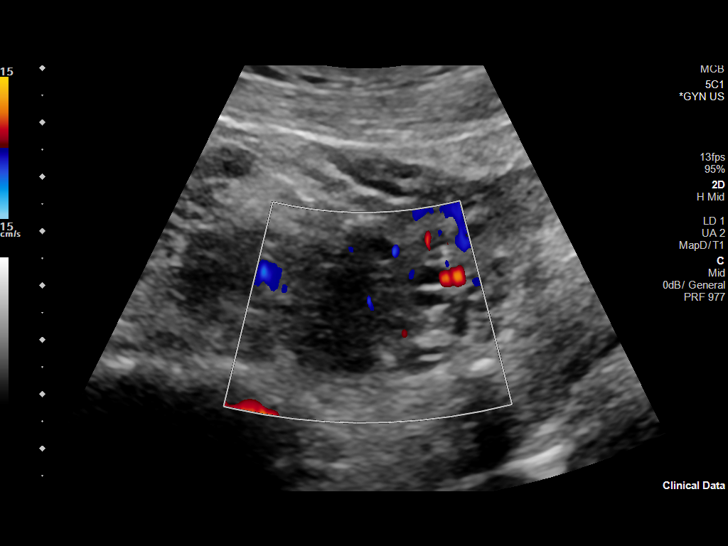
[im 43/124]
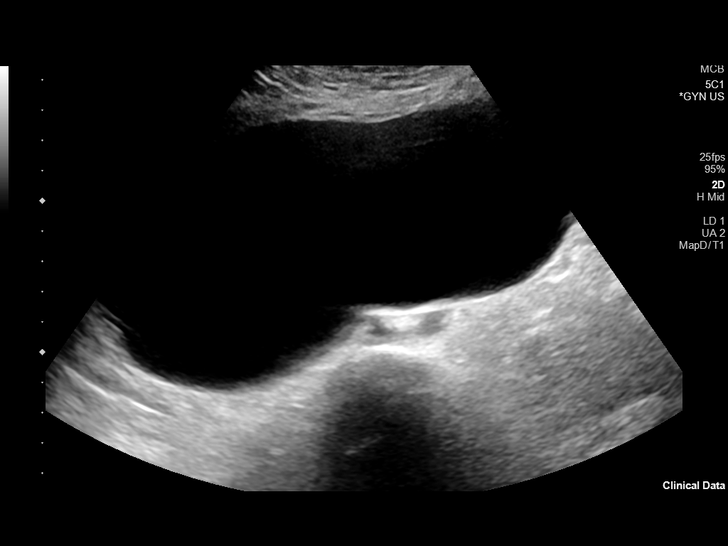
[im 54/124]
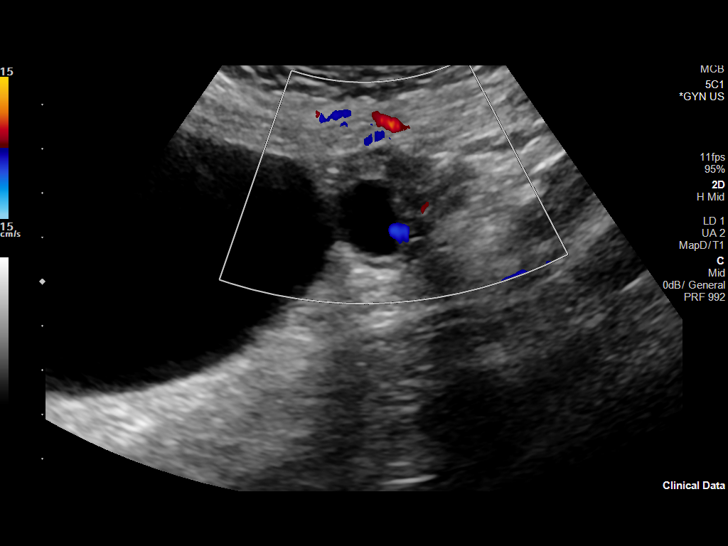
[im 65/124]
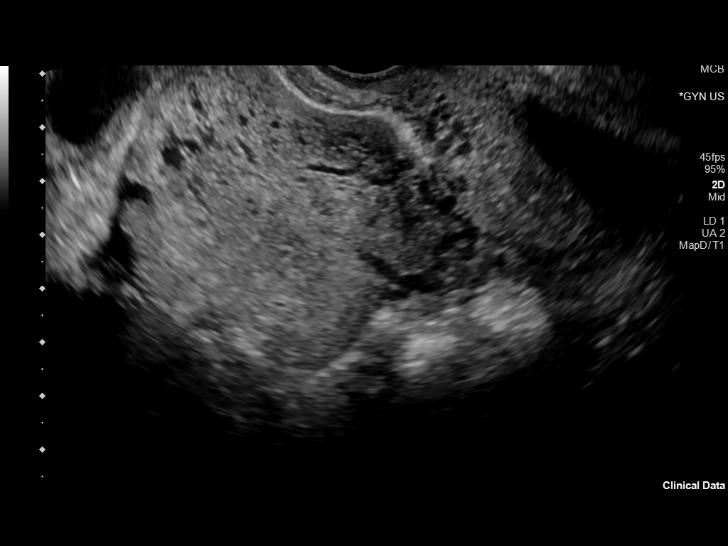
[im 75/124]
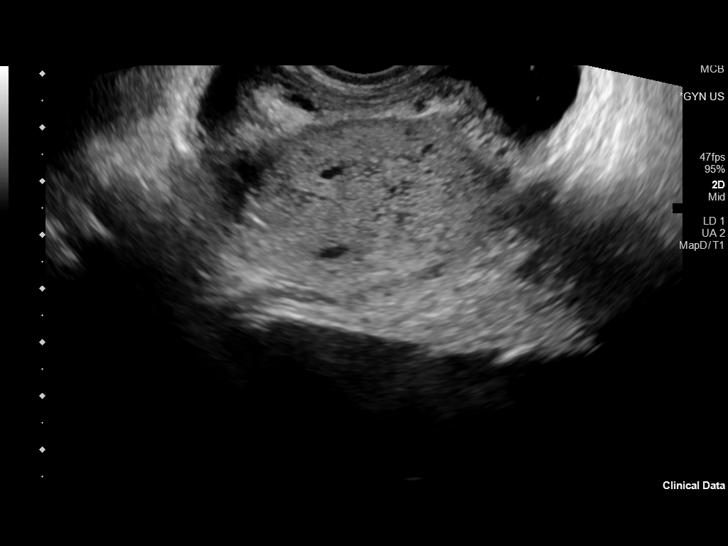
[im 86/124]
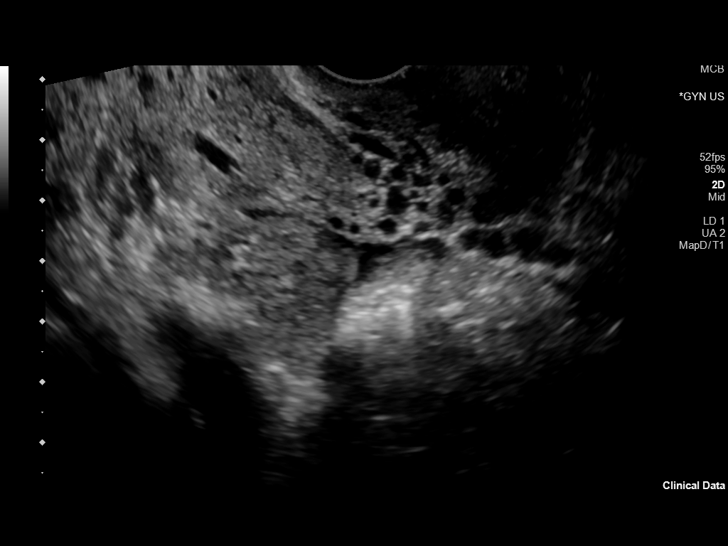
[im 97/124]
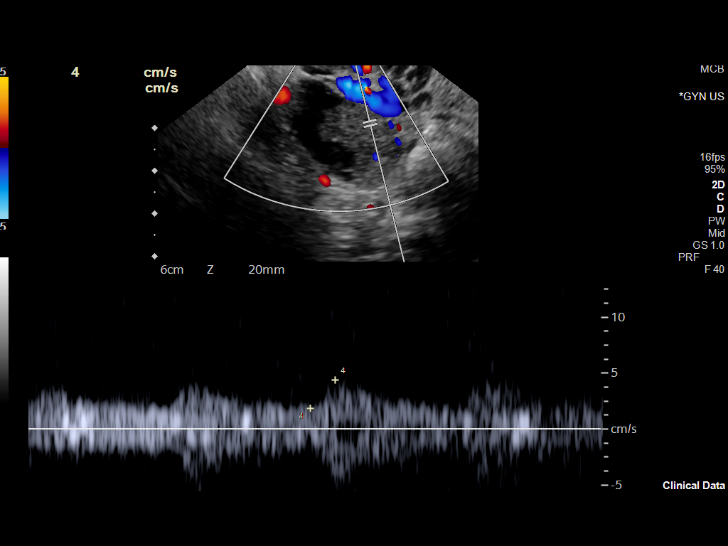
[im 107/124]
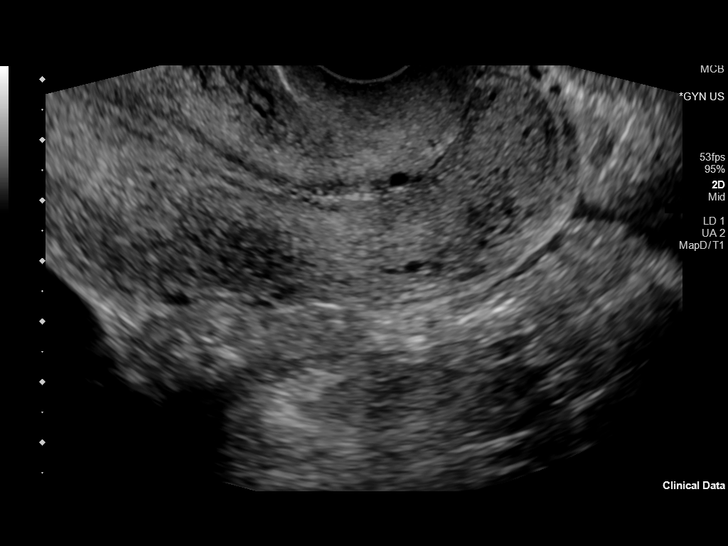
[im 118/124]
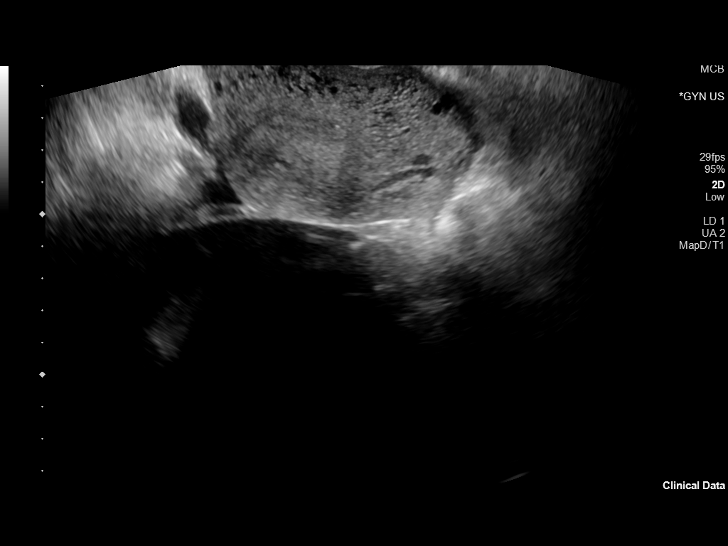

[Series 2: us pelvis complete · 1 of 2 slices shown (2 of 2)]
[im 1/2]
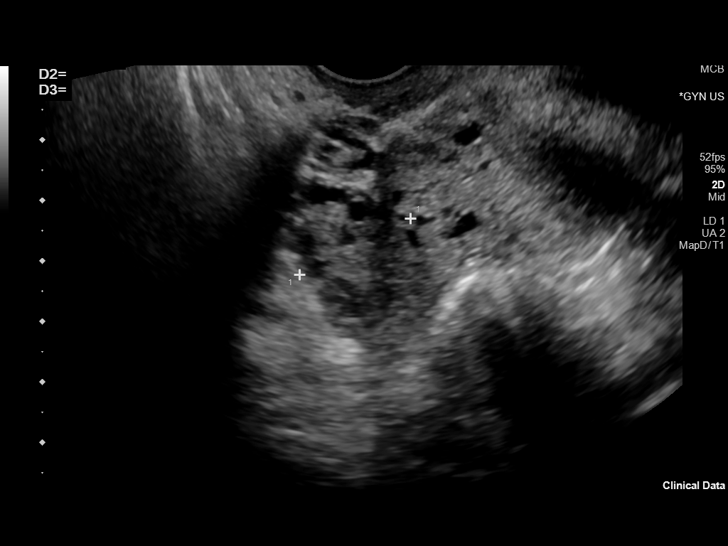

[13 of 25 positions shown; findings below may reference images not displayed]

FINDINGS: Uterus

Measurements: 10.7 x 6.7 x 6.7 cm = volume: 249.7 ML. No fibroids or
other mass visualized.

Endometrium

Thickness: 6.6.  No focal abnormality visualized.

Right ovary

Measurements: 3.8 x 1.9 x 2.1 cm = volume: 7.7 mL. Normal
appearance/no adnexal mass.

Left ovary

Measurements: 3.5 x 3.4 x 7.8 cm = volume: 48.8 mL. 16.0 x 13.1 x
7.2 cm cyst noted on the left ovary. Although the cyst appears to be
simple has increased in size from prior exam and cystic ovarian
malignancy cannot be completely excluded.

Pulsed Doppler evaluation of both ovaries demonstrates normal
low-resistance arterial and venous waveforms.

Other findings

Trace free pelvic fluid.
IMPRESSION: 1. Interval growth of large left ovarian cyst with a cyst now
measuring 6.0 x 13.1 x 7.2 cm. Although the cysts appears to be
simple, given interval growth cystic ovarian malignancy cannot be
completely excluded. Gynecologic consultation is suggested.

2.  No evidence of ovarian torsion.  Trace free pelvic fluid.

## 2020-12-15 ENCOUNTER — Emergency Department (HOSPITAL_COMMUNITY)
Admission: EM | Admit: 2020-12-15 | Discharge: 2020-12-15 | Disposition: A | Discharge status: Home / Self Care | Payer: BLUE CROSS/BLUE SHIELD | Attending: Emergency Medicine | Admitting: Emergency Medicine

## 2020-12-15 DIAGNOSIS — Y905 Blood alcohol level of 100-119 mg/100 ml: Secondary | ICD-10-CM | POA: Insufficient documentation

## 2020-12-15 DIAGNOSIS — F17220 Nicotine dependence, chewing tobacco, uncomplicated: Secondary | ICD-10-CM | POA: Insufficient documentation

## 2020-12-15 DIAGNOSIS — F10920 Alcohol use, unspecified with intoxication, uncomplicated: Secondary | ICD-10-CM

## 2020-12-15 DIAGNOSIS — F10929 Alcohol use, unspecified with intoxication, unspecified: Secondary | ICD-10-CM | POA: Insufficient documentation

## 2020-12-15 LAB — COMPREHENSIVE METABOLIC PANEL
ALT: 10 U/L (ref 0–44)
AST: 13 U/L — ABNORMAL LOW (ref 15–41)
Albumin: 3.9 g/dL (ref 3.5–5.0)
Alkaline Phosphatase: 38 U/L (ref 38–126)
Anion gap: 9 (ref 5–15)
BUN: 11 mg/dL (ref 6–20)
CO2: 21 mmol/L — ABNORMAL LOW (ref 22–32)
Calcium: 8.8 mg/dL — ABNORMAL LOW (ref 8.9–10.3)
Chloride: 111 mmol/L (ref 98–111)
Creatinine, Ser: 0.78 mg/dL (ref 0.44–1.00)
GFR, Estimated: >60 mL/min (ref >60)
Glucose, Bld: 98 mg/dL (ref 70–99)
Potassium: 3.8 mmol/L (ref 3.5–5.1)
Sodium: 141 mmol/L (ref 135–145)
Total Bilirubin: 0.8 mg/dL (ref 0.3–1.2)
Total Protein: 6.4 g/dL — ABNORMAL LOW (ref 6.5–8.1)

## 2020-12-15 LAB — CBC WITH DIFFERENTIAL/PLATELET
Abs Immature Granulocytes: 0.03 10*3/uL (ref 0.00–0.07)
Basophils Absolute: 0 10*3/uL (ref 0.0–0.1)
Basophils Relative: 1 %
Eosinophils Absolute: 0.2 10*3/uL (ref 0.0–0.5)
Eosinophils Relative: 5 %
HCT: 36 % (ref 36.0–46.0)
Hemoglobin: 11.8 g/dL — ABNORMAL LOW (ref 12.0–15.0)
Immature Granulocytes: 1 %
Lymphocytes Relative: 26 %
Lymphs Abs: 0.8 10*3/uL (ref 0.7–4.0)
MCH: 30.4 pg (ref 26.0–34.0)
MCHC: 32.8 g/dL (ref 30.0–36.0)
MCV: 92.8 fL (ref 80.0–100.0)
Monocytes Absolute: 0.2 10*3/uL (ref 0.1–1.0)
Monocytes Relative: 8 %
Neutro Abs: 1.8 10*3/uL (ref 1.7–7.7)
Neutrophils Relative %: 59 %
Platelets: 167 10*3/uL (ref 150–400)
RBC: 3.88 MIL/uL (ref 3.87–5.11)
RDW: 13.7 % (ref 11.5–15.5)
WBC: 3 10*3/uL — ABNORMAL LOW (ref 4.0–10.5)
nRBC: 0 % (ref 0.0–0.2)

## 2020-12-15 LAB — ETHANOL: Alcohol, Ethyl (B): 118 mg/dL — ABNORMAL HIGH (ref <10)

## 2020-12-15 LAB — URINALYSIS, ROUTINE W REFLEX MICROSCOPIC
Bilirubin Urine: NEGATIVE
Glucose, UA: NEGATIVE mg/dL
Ketones, ur: NEGATIVE mg/dL
Leukocytes,Ua: NEGATIVE
Nitrite: NEGATIVE
Protein, ur: NEGATIVE mg/dL
Specific Gravity, Urine: 1.006 (ref 1.005–1.030)
pH: 6 (ref 5.0–8.0)

## 2020-12-15 LAB — ACETAMINOPHEN LEVEL: Acetaminophen (Tylenol), Serum: <10 ug/mL — ABNORMAL LOW (ref 10–30)

## 2020-12-15 LAB — SALICYLATE LEVEL: Salicylate Lvl: <7 mg/dL — ABNORMAL LOW (ref 7.0–30.0)

## 2020-12-15 MED ORDER — LORAZEPAM 2 MG/ML IJ SOLN
1.0000 mg | Freq: Once | INTRAMUSCULAR | Status: AC
  Administered 2020-12-15: 1 mg via INTRAMUSCULAR

## 2020-12-15 MED ORDER — SODIUM CHLORIDE 0.9 % IV BOLUS: 1000.0000 mL | Freq: Once | INTRAVENOUS | Status: DC

## 2020-12-15 MED ORDER — LORAZEPAM 2 MG/ML IJ SOLN
1.0000 mg | Freq: Once | INTRAMUSCULAR | Status: DC
  Filled 2020-12-15: qty 1

## 2020-12-15 NOTE — Discharge Instructions (Signed)
Please follow-up with your lab results via MyChart.  You were provided today with multiple resources to help with counseling, please consider scheduling an appointment for follow-up.  If you experience any concerns, changes in behavior, please return to the emergency department.

## 2020-12-15 NOTE — ED Notes (Signed)
Pt able to tolerate PO fluids at this time.  
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
## 2021-01-10 ENCOUNTER — Encounter (HOSPITAL_COMMUNITY): Payer: Self-pay

## 2021-01-10 ENCOUNTER — Other Ambulatory Visit: Payer: Self-pay

## 2021-01-10 ENCOUNTER — Emergency Department (HOSPITAL_COMMUNITY)
Admission: EM | Admit: 2021-01-10 | Discharge: 2021-01-11 | Disposition: A | Discharge status: Home / Self Care | Payer: BLUE CROSS/BLUE SHIELD | Attending: Emergency Medicine | Admitting: Emergency Medicine

## 2021-01-10 DIAGNOSIS — F10929 Alcohol use, unspecified with intoxication, unspecified: Secondary | ICD-10-CM | POA: Insufficient documentation

## 2021-01-10 DIAGNOSIS — Y904 Blood alcohol level of 80-99 mg/100 ml: Secondary | ICD-10-CM | POA: Diagnosis not present

## 2021-01-10 DIAGNOSIS — Z79899 Other long term (current) drug therapy: Secondary | ICD-10-CM | POA: Insufficient documentation

## 2021-01-10 DIAGNOSIS — S060X1A Concussion with loss of consciousness of 30 minutes or less, initial encounter: Secondary | ICD-10-CM | POA: Insufficient documentation

## 2021-01-10 DIAGNOSIS — M542 Cervicalgia: Secondary | ICD-10-CM | POA: Insufficient documentation

## 2021-01-10 DIAGNOSIS — S0990XA Unspecified injury of head, initial encounter: Secondary | ICD-10-CM | POA: Diagnosis present

## 2021-01-10 DIAGNOSIS — W19XXXA Unspecified fall, initial encounter: Secondary | ICD-10-CM | POA: Insufficient documentation

## 2021-01-10 LAB — URINALYSIS, ROUTINE W REFLEX MICROSCOPIC
Bilirubin Urine: NEGATIVE
Glucose, UA: NEGATIVE mg/dL
Ketones, ur: NEGATIVE mg/dL
Leukocytes,Ua: NEGATIVE
Nitrite: NEGATIVE
Protein, ur: NEGATIVE mg/dL
Specific Gravity, Urine: 1.004 — ABNORMAL LOW (ref 1.005–1.030)
pH: 6 (ref 5.0–8.0)

## 2021-01-10 LAB — BASIC METABOLIC PANEL
Anion gap: 8 (ref 5–15)
BUN: 11 mg/dL (ref 6–20)
CO2: 20 mmol/L — ABNORMAL LOW (ref 22–32)
Calcium: 8.9 mg/dL (ref 8.9–10.3)
Chloride: 109 mmol/L (ref 98–111)
Creatinine, Ser: 1.1 mg/dL — ABNORMAL HIGH (ref 0.44–1.00)
GFR, Estimated: >60 mL/min (ref >60)
Glucose, Bld: 92 mg/dL (ref 70–99)
Potassium: 3.6 mmol/L (ref 3.5–5.1)
Sodium: 137 mmol/L (ref 135–145)

## 2021-01-10 LAB — CBC
HCT: 35.7 % — ABNORMAL LOW (ref 36.0–46.0)
Hemoglobin: 11.7 g/dL — ABNORMAL LOW (ref 12.0–15.0)
MCH: 30.3 pg (ref 26.0–34.0)
MCHC: 32.8 g/dL (ref 30.0–36.0)
MCV: 92.5 fL (ref 80.0–100.0)
Platelets: 201 10*3/uL (ref 150–400)
RBC: 3.86 MIL/uL — ABNORMAL LOW (ref 3.87–5.11)
RDW: 14 % (ref 11.5–15.5)
WBC: 4.8 10*3/uL (ref 4.0–10.5)
nRBC: 0 % (ref 0.0–0.2)

## 2021-01-10 LAB — I-STAT BETA HCG BLOOD, ED (MC, WL, AP ONLY): I-stat hCG, quantitative: <5 m[IU]/mL (ref <5)

## 2021-01-10 MED ORDER — ACETAMINOPHEN 500 MG PO TABS
1000.0000 mg | ORAL_TABLET | Freq: Once | ORAL | Status: AC
  Administered 2021-01-11: 1000 mg via ORAL
  Filled 2021-01-10: qty 2

## 2021-01-10 NOTE — ED Notes (Addendum)
Patient refused vital signs assessment.
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
## 2021-01-10 NOTE — ED Provider Notes (Signed)
[facility] EMERGENCY DEPARTMENT Provider Note   CSN: [id number] Arrival date & time: 01/10/21  2245     History Chief Complaint  Patient presents with  . Loss of Consciousness    Frances Reed is a 30 y.o. female.  Patient presents to the emergency department for evaluation of syncope.  Patient has been drinking alcohol today.  Patient apparently either passed out and fell or fell and got knocked out earlier.  She hit the back of her head.  She is experiencing headache and neck pain after the fall.  Patient does not remember the fall.        Past Medical History:  Diagnosis Date  . Generalized anxiety disorder 06/13/2015  . History of kidney stones   . Klippel-Trenaunay-Weber syndrome   . Sleep apnea     Patient Active Problem List   Diagnosis Date Noted  . Palpitations 10/20/2019  . Precordial chest pain 09/20/2019  . Educated about COVID-19 virus infection 09/20/2019  . 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 Pain of right lower leg 10/18/2015  . Generalized anxiety disorder 06/13/2015  . Carpal tunnel syndrome 01/18/2015  . 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 FOOT SURGERY Bilateral   . LAPAROSCOPY N/A 05/21/2019   Procedure: LAPAROSCOPY DIAGNOSTIC, Left Pelvic Cystectomy ;  Surgeon: Hermina Staggers, MD;  Location: [facility] OR;  Service: Gynecology;  Laterality: N/A;  . SKIN GRAFT Right 2000   abdominal graft for foot  . THROAT SURGERY    . TONSILLECTOMY       OB History    Gravida  1   Para  1   Term  1   Preterm      AB      Living  1     SAB      IAB      Ectopic      Multiple      Live Births  1           History reviewed. No pertinent family history.  Social History   Tobacco Use  . Smoking status: Never Smoker  . Smokeless tobacco: Current User    Types: Chew  Vaping Use  . Vaping Use: Never used  Substance Use Topics  . Alcohol use:  Yes    Alcohol/week: 3.0 standard drinks    Types: 3 Cans of beer per week    Comment: per week  . Drug use: No    Home Medications Prior to Admission medications   Medication Sig Start Date End Date Taking? Authorizing Provider  clonazePAM (KLONOPIN) 0.5 MG tablet Take 0.5 tablets (0.25 mg total) by mouth 2 (two) times daily as needed for anxiety. Patient not taking: Reported on 09/17/2020 04/10/17   Ellyn Hack, MD  cyclobenzaprine (FLEXERIL) 10 MG tablet Take 1 tablet (10 mg total) by mouth 2 (two) times daily as needed for muscle spasms. Patient not taking: Reported on 09/17/2020 01/06/20   Moshe Cipro, NP  gabapentin (NEURONTIN) 100 MG capsule TAKE 1 CAPSULE BY MOUTH TWICE A DAY 06/24/17   Ellyn Hack, MD  gabapentin (NEURONTIN) 100 MG capsule Take 100 mg by mouth 2 (two) times daily.    [provider]  Levothyroxine Sodium (SYNTHROID PO) Take by mouth.    [provider]  naproxen (NAPROSYN) 500 MG tablet Take 1 tablet (500 mg total) by mouth 2 (two) times daily. 01/06/20  Moshe CiproMatthews, Stephanie, NP  PARoxetine (PAXIL) 10 MG tablet Take 10 mg by mouth daily. Patient not taking: Reported on 09/17/2020    [provider]  PARoxetine (PAXIL) 10 MG tablet Take 10 mg by mouth daily. Patient not taking: Reported on 09/17/2020    [provider]  predniSONE (DELTASONE) 20 MG tablet Take 2 tablets daily with breakfast. 09/17/20   Wallis BambergMani, Mario, PA-C  promethazine (PHENERGAN) 25 MG tablet Take 1 tablet (25 mg total) by mouth every 6 (six) hours as needed for nausea or vomiting. 11/03/19   Lawyer, Cristal Deerhristopher, PA-C  traMADol (ULTRAM) 50 MG tablet Take 1 tablet (50 mg total) by mouth every 6 (six) hours as needed. Patient not taking: Reported on 09/17/2020 01/06/20   Moshe CiproMatthews, Stephanie, NP    Allergies    Coconut oil and Motrin [ibuprofen]  Review of Systems   Review of Systems  Musculoskeletal: Positive for neck pain.  Neurological: Positive  for headaches.  All other systems reviewed and are negative.   Physical Exam Updated Vital Signs BP 118/86 (BP Location: Right Arm)   Pulse 88   Temp 97.9 F (36.6 C) (Oral)   Resp 18   Ht 5\' 2"  (1.575 m)   Wt 81 kg   SpO2 98%   BMI 32.66 kg/m   Physical Exam Vitals and nursing note reviewed.  Constitutional:      General: She is not in acute distress.    Appearance: Normal appearance. She is well-developed.  HENT:     Head: Normocephalic and atraumatic.      Right Ear: Hearing normal.     Left Ear: Hearing normal.     Nose: Nose normal.  Eyes:     Conjunctiva/sclera: Conjunctivae normal.     Pupils: Pupils are equal, round, and reactive to light.  Cardiovascular:     Rate and Rhythm: Regular rhythm.     Heart sounds: S1 normal and S2 normal. No murmur heard. No friction rub. No gallop.   Pulmonary:     Effort: Pulmonary effort is normal. No respiratory distress.     Breath sounds: Normal breath sounds.  Chest:     Chest wall: No tenderness.  Abdominal:     General: Bowel sounds are normal.     Palpations: Abdomen is soft.     Tenderness: There is no abdominal tenderness. There is no guarding or rebound. Negative signs include Murphy's sign and McBurney's sign.     Hernia: No hernia is present.  Musculoskeletal:        General: Normal range of motion.     Cervical back: Normal range of motion and neck supple.  Skin:    General: Skin is warm and dry.     Findings: No rash.  Neurological:     Mental Status: She is alert and oriented to person, place, and time.     GCS: GCS eye subscore is 4. GCS verbal subscore is 5. GCS motor subscore is 6.     Cranial Nerves: No cranial nerve deficit.     Sensory: No sensory deficit.     Coordination: Coordination normal.  Psychiatric:        Speech: Speech is slurred.        Behavior: Behavior normal.        Thought Content: Thought content normal.        Cognition and Memory: She exhibits impaired recent memory.      ED Results / Procedures / Treatments   Labs (all labs  ordered are listed, but only abnormal results are displayed) Labs Reviewed  BASIC METABOLIC PANEL - Abnormal; Notable for the following components:      Result Value   CO2 20 (*)    Creatinine, Ser 1.10 (*)    All other components within normal limits  CBC - Abnormal; Notable for the following components:   RBC 3.86 (*)    Hemoglobin 11.7 (*)    HCT 35.7 (*)    All other components within normal limits  URINALYSIS, ROUTINE W REFLEX MICROSCOPIC - Abnormal; Notable for the following components:   Color, Urine STRAW (*)    APPearance HAZY (*)    Specific Gravity, Urine 1.004 (*)    Hgb urine dipstick SMALL (*)    Bacteria, UA RARE (*)    All other components within normal limits  ETHANOL - Abnormal; Notable for the following components:   Alcohol, Ethyl (B) 80 (*)    All other components within normal limits  RAPID URINE DRUG SCREEN, HOSP PERFORMED  CBG MONITORING, ED  I-STAT BETA HCG BLOOD, ED ([facility], [facility], [facility] ONLY)    EKG None  Radiology CT HEAD WO CONTRAST  Result Date: 01/11/2021 CLINICAL DATA:  Trauma and alcohol intoxication. EXAM: CT HEAD WITHOUT CONTRAST CT CERVICAL SPINE WITHOUT CONTRAST TECHNIQUE: Multidetector CT imaging of the head and cervical spine was performed following the standard protocol without intravenous contrast. Multiplanar CT image reconstructions of the cervical spine were also generated. COMPARISON:  None. FINDINGS: CT HEAD FINDINGS Brain: There is no mass, hemorrhage or extra-axial collection. The size and configuration of the ventricles and extra-axial CSF spaces are normal. The brain parenchyma is normal, without evidence of acute or chronic infarction. Vascular: No abnormal hyperdensity of the major intracranial arteries or dural venous sinuses. No intracranial atherosclerosis. Skull: The visualized skull base, calvarium and extracranial soft tissues are normal. Sinuses/Orbits: No fluid levels or  advanced mucosal thickening of the visualized paranasal sinuses. No mastoid or middle ear effusion. The orbits are normal. CT CERVICAL SPINE FINDINGS Alignment: No static subluxation. Facets are aligned. Occipital condyles are normally positioned. Skull base and vertebrae: No acute fracture. Soft tissues and spinal canal: No prevertebral fluid or swelling. No visible canal hematoma. Disc levels: No advanced spinal canal or neural foraminal stenosis. Upper chest: No pneumothorax, pulmonary nodule or pleural effusion. Other: Normal visualized paraspinal cervical soft tissues. IMPRESSION: 1. No acute intracranial abnormality. 2. No acute fracture or static subluxation of the cervical spine. Electronically Signed   By: Deatra Robinson M.D.   On: 01/11/2021 02:18   CT CERVICAL SPINE WO CONTRAST  Result Date: 01/11/2021 CLINICAL DATA:  Trauma and alcohol intoxication. EXAM: CT HEAD WITHOUT CONTRAST CT CERVICAL SPINE WITHOUT CONTRAST TECHNIQUE: Multidetector CT imaging of the head and cervical spine was performed following the standard protocol without intravenous contrast. Multiplanar CT image reconstructions of the cervical spine were also generated. COMPARISON:  None. FINDINGS: CT HEAD FINDINGS Brain: There is no mass, hemorrhage or extra-axial collection. The size and configuration of the ventricles and extra-axial CSF spaces are normal. The brain parenchyma is normal, without evidence of acute or chronic infarction. Vascular: No abnormal hyperdensity of the major intracranial arteries or dural venous sinuses. No intracranial atherosclerosis. Skull: The visualized skull base, calvarium and extracranial soft tissues are normal. Sinuses/Orbits: No fluid levels or advanced mucosal thickening of the visualized paranasal sinuses. No mastoid or middle ear effusion. The orbits are normal. CT CERVICAL SPINE FINDINGS Alignment: No static subluxation. Facets are aligned. Occipital condyles  are normally positioned. Skull base  and vertebrae: No acute fracture. Soft tissues and spinal canal: No prevertebral fluid or swelling. No visible canal hematoma. Disc levels: No advanced spinal canal or neural foraminal stenosis. Upper chest: No pneumothorax, pulmonary nodule or pleural effusion. Other: Normal visualized paraspinal cervical soft tissues. IMPRESSION: 1. No acute intracranial abnormality. 2. No acute fracture or static subluxation of the cervical spine. Electronically Signed   By: Deatra Robinson M.D.   On: 01/11/2021 02:18    Procedures Procedures   Medications Ordered in ED Medications  acetaminophen (TYLENOL) tablet 1,000 mg (1,000 mg Oral Given 01/11/21 0013)    ED Course  I have reviewed the triage vital signs and the nursing notes.  Pertinent labs & imaging results that were available during my care of the patient were reviewed by me and considered in my medical decision making (see chart for details).    MDM Rules/Calculators/A&P                          Patient presents to the emergency department after a fall.  Has been drinking, intoxication likely caused the fall.  There was likely brief loss of consciousness.  Patient with head and neck pain at arrival.  Patient is asking repeatedly what happened, presentation consistent with mild concussion.  No neurologic findings otherwise.  CT head and cervical spine are negative.  No other area of injury.  Final Clinical Impression(s) / ED Diagnoses Final diagnoses:  Concussion with loss of consciousness of 30 minutes or less, initial encounter    Rx / DC Orders ED Discharge Orders    None       Jhovany Weidinger, Canary Brim, MD 01/11/21 0225

## 2021-01-10 NOTE — ED Notes (Signed)
Sort NT reports to this RN that pt passed out again in the waiting room.

## 2021-01-11 ENCOUNTER — Emergency Department (HOSPITAL_COMMUNITY): Payer: BLUE CROSS/BLUE SHIELD

## 2021-01-11 LAB — RAPID URINE DRUG SCREEN, HOSP PERFORMED
Amphetamines: NOT DETECTED
Barbiturates: NOT DETECTED
Benzodiazepines: NOT DETECTED
Cocaine: NOT DETECTED
Opiates: NOT DETECTED
Tetrahydrocannabinol: NOT DETECTED

## 2021-01-11 LAB — ETHANOL: Alcohol, Ethyl (B): 80 mg/dL — ABNORMAL HIGH (ref <10)

## 2021-01-11 NOTE — ED Notes (Signed)
Patient refusing vitals at this time.

## 2021-03-13 ENCOUNTER — Ambulatory Visit (INDEPENDENT_AMBULATORY_CARE_PROVIDER_SITE_OTHER): Payer: BLUE CROSS/BLUE SHIELD

## 2021-03-13 ENCOUNTER — Ambulatory Visit
Admission: EM | Admit: 2021-03-13 | Discharge: 2021-03-13 | Disposition: A | Discharge status: Home / Self Care | Payer: BLUE CROSS/BLUE SHIELD | Attending: Emergency Medicine | Admitting: Emergency Medicine

## 2021-03-13 ENCOUNTER — Encounter: Payer: Self-pay | Admitting: Emergency Medicine

## 2021-03-13 ENCOUNTER — Other Ambulatory Visit: Payer: Self-pay

## 2021-03-13 DIAGNOSIS — M25531 Pain in right wrist: Secondary | ICD-10-CM | POA: Diagnosis not present

## 2021-03-13 DIAGNOSIS — S63501A Unspecified sprain of right wrist, initial encounter: Secondary | ICD-10-CM

## 2021-03-13 DIAGNOSIS — W19XXXA Unspecified fall, initial encounter: Secondary | ICD-10-CM | POA: Diagnosis not present

## 2021-03-13 MED ORDER — IBUPROFEN 600 MG PO TABS: 600.0000 mg | ORAL_TABLET | Freq: Four times a day (QID) | ORAL | 0 refills | Status: DC | PRN

## 2021-03-13 MED ORDER — HYDROCODONE-ACETAMINOPHEN 5-325 MG PO TABS: 1.0000 | ORAL_TABLET | Freq: Four times a day (QID) | ORAL | 0 refills | Status: DC | PRN

## 2021-03-13 NOTE — ED Triage Notes (Signed)
PT fell backward on Sunday and caught herself on her right wrist. Now her pain is radiating from wrist to elbow.

## 2021-03-13 NOTE — ED Provider Notes (Signed)
HPI  SUBJECTIVE:  Frances Reed is a right-handed 30 y.o. female who presents with right wrist pain after having 2 falls.  States that she slipped backward and fell on an outstretched right hand on concrete 2 days ago.  She states that she tripped over her puppy today, and fell onto her outstretched right hand again, states that the pain is worse and radiates up her forearm into her elbow.  No bruising, swelling, numbness or tingling.  She reports limitation of motion of the wrist secondary to the pain.  She tried Tylenol 500 every 6 hours with improvement in her symptoms.  Symptoms are worse with movement, lifting, pulling.  She is a CMA.  Past medical history negative for right wrist, arm, elbow injury.  She has a history of Klippel-Trenaunay-Weber syndrome.  LMP: Now.  Denies the possibility of being pregnant.  PMD: [place].    Past Medical History:  Diagnosis Date   Generalized anxiety disorder 06/13/2015   History of kidney stones    Klippel-Trenaunay-Weber syndrome    Sleep apnea     Past Surgical History:  Procedure Laterality Date   FOOT SURGERY Bilateral    LAPAROSCOPY N/A 05/21/2019   Procedure: LAPAROSCOPY DIAGNOSTIC, Left Pelvic Cystectomy ;  Surgeon: Hermina Staggers, MD;  Location: [facility] OR;  Service: Gynecology;  Laterality: N/A;   SKIN GRAFT Right 2000   abdominal graft for foot   THROAT SURGERY     TONSILLECTOMY      History reviewed. No pertinent family history.  Social History   Tobacco Use   Smoking status: Never   Smokeless tobacco: Current    Types: Chew  Vaping Use   Vaping Use: Never used  Substance Use Topics   Alcohol use: Yes    Alcohol/week: 3.0 standard drinks    Types: 3 Cans of beer per week    Comment: per week   Drug use: No    No current facility-administered medications for this encounter.  Current Outpatient Medications:    HYDROcodone-acetaminophen (NORCO/VICODIN) 5-325 MG tablet, Take 1-2 tablets by mouth every 6 (six) hours as  needed for moderate pain or severe pain., Disp: 12 tablet, Rfl: 0   ibuprofen (ADVIL) 600 MG tablet, Take 1 tablet (600 mg total) by mouth every 6 (six) hours as needed., Disp: 30 tablet, Rfl: 0   gabapentin (NEURONTIN) 100 MG capsule, TAKE 1 CAPSULE BY MOUTH TWICE A DAY, Disp: 180 capsule, Rfl: 0   gabapentin (NEURONTIN) 100 MG capsule, Take 100 mg by mouth 2 (two) times daily., Disp: , Rfl:    Levothyroxine Sodium (SYNTHROID PO), Take by mouth., Disp: , Rfl:    PARoxetine (PAXIL) 10 MG tablet, Take 10 mg by mouth daily. (Patient not taking: Reported on 09/17/2020), Disp: , Rfl:    PARoxetine (PAXIL) 10 MG tablet, Take 10 mg by mouth daily. (Patient not taking: Reported on 09/17/2020), Disp: , Rfl:   Allergies  Allergen Reactions   Coconut Oil Anaphylaxis   Motrin [Ibuprofen] Other (See Comments)    Klippel Trenauney Syndrome "Doesn't like to take it"     ROS  As noted in HPI.   Physical Exam  BP 121/85   Pulse (!) 101   Temp 98.5 F (36.9 C) (Oral)   Resp 20   SpO2 98%   Constitutional: Well developed, well nourished, no acute distress Eyes:  EOMI, conjunctiva normal bilaterally HENT: Normocephalic, atraumatic,mucus membranes moist Respiratory: Normal inspiratory effort Cardiovascular: Normal rate GI: nondistended skin: No rash, skin intact  Musculoskeletal: no deformities R Elbow ROM Normal for Pt.  Supracondylar region NT  Radial head NT  Olecrenon process mildly tender, Medial epicondyle tender, Lateral epicondyle tender. R wrist positive mild swelling.  Tenderness over the dorsum of the wrist.  Distal radius NT , distal ulnar styloid tender, snuffbox NT carpals NT , metacarpals NT, digits NT , TFCC tender. no pain with supination,  no pain with pronation, pain with radial / ulnar deviation, extension.  Mild pain with flexion. Motor intact ability to flex / extend digits of affected hand, Sensation LT to hand normal.  RP 2+.  Mild tenderness along the entire forearm.    Neurologic: Alert & oriented x 3, no focal neuro deficits Psychiatric: Speech and behavior appropriate   ED Course   Medications - No data to display  Orders Placed This Encounter  Procedures   DG Wrist Complete Right    Standing Status:   Standing    Number of Occurrences:   1    Order Specific Question:   Reason for Exam (SYMPTOM  OR DIAGNOSIS REQUIRED)    Answer:   FOOSH x2, ulnar tenderness r/o fx   Apply Wrist brace    Standing Status:   Standing    Number of Occurrences:   1    Order Specific Question:   Laterality    Answer:   Right    No results found for this or any previous visit (from the past 24 hour(s)). DG Wrist Complete Right  Result Date: 03/13/2021 CLINICAL DATA:  Right wrist pain after fall. EXAM: RIGHT WRIST - COMPLETE 3+ VIEW COMPARISON:  None. FINDINGS: There is no evidence of fracture or dislocation. There is no evidence of arthropathy or other focal bone abnormality. Soft tissues are unremarkable. IMPRESSION: Negative. Electronically Signed   By: Lupita Raider M.D.   On: 03/13/2021 19:39    ED Clinical Impression  1. Sprain of right wrist, initial encounter      ED Assessment/Plan  We discussed doing an elbow x-ray, but she declined, states that she does not feel that she sustained any direct trauma to it, she is able to move it throughout active full range of motion without any problem.  We will x-ray right wrist.  [place] Narcotic database reviewed for this patient, and feel that the risk/benefit ratio today is favorable for proceeding with a prescription for controlled substance.  Last opiate prescription in 2021.  Will advise to not take Klonopin while taking narcotics.  Reviewed imaging independently.  Normal x-rays.  See radiology report for full details.  Presentation consistent with a wrist sprain versus TFCC injury.  We will put in wrist splint, sent home with Tylenol/ibuprofen, ice, elevation, follow-up with Dr. Merlyn Lot, hand on-call, within a  week.  Work note for 2 days, light duty for a week.  Discussed imaging, MDM, treatment plan, and plan for follow-up with patient. Discussed sn/sx that should prompt return to the ED. patient agrees with plan.   Meds ordered this encounter  Medications   HYDROcodone-acetaminophen (NORCO/VICODIN) 5-325 MG tablet    Sig: Take 1-2 tablets by mouth every 6 (six) hours as needed for moderate pain or severe pain.    Dispense:  12 tablet    Refill:  0   ibuprofen (ADVIL) 600 MG tablet    Sig: Take 1 tablet (600 mg total) by mouth every 6 (six) hours as needed.    Dispense:  30 tablet    Refill:  0      *  This clinic note was created using Scientist, clinical (histocompatibility and immunogenetics). Therefore, there may be occasional mistakes despite careful proofreading.  ?    Domenick Gong, MD 03/15/21 1109

## 2021-03-13 NOTE — Discharge Instructions (Addendum)
You have a wrist sprain, but you could have a TFCC injury.  I am giving you information on it.  It is difficult to distinguish between the 2.  Your x-rays were normal today.  Wear the splint at all times.  Take 600 mg of ibuprofen combined with a Tylenol containing product 3-4 times a day as needed for pain.  Either the ibuprofen/1000 mg of Tylenol for mild to moderate pain or the ibuprofen with 1-2 Norco for severe pain.  Do not take Tylenol and Norco as we discussed.  Do not take the Klonopin if you are taking the Norco.  Do not take more than 4000 mg of Tylenol from all sources in 1 day.

## 2021-03-20 ENCOUNTER — Other Ambulatory Visit: Payer: Self-pay

## 2021-03-20 ENCOUNTER — Ambulatory Visit (HOSPITAL_BASED_OUTPATIENT_CLINIC_OR_DEPARTMENT_OTHER)
Admission: RE | Admit: 2021-03-20 | Discharge: 2021-03-20 | Disposition: A | Discharge status: Home / Self Care | Payer: BLUE CROSS/BLUE SHIELD | Source: Ambulatory Visit | Attending: Nurse Practitioner | Admitting: Nurse Practitioner

## 2021-03-20 ENCOUNTER — Other Ambulatory Visit (HOSPITAL_BASED_OUTPATIENT_CLINIC_OR_DEPARTMENT_OTHER): Payer: Self-pay | Admitting: Nurse Practitioner

## 2021-03-20 DIAGNOSIS — M545 Low back pain, unspecified: Secondary | ICD-10-CM | POA: Insufficient documentation

## 2021-05-20 ENCOUNTER — Other Ambulatory Visit: Payer: Self-pay

## 2021-05-20 ENCOUNTER — Emergency Department (HOSPITAL_COMMUNITY)
Admission: EM | Admit: 2021-05-20 | Discharge: 2021-05-21 | Disposition: A | Discharge status: Home / Self Care | Payer: BLUE CROSS/BLUE SHIELD | Attending: Emergency Medicine | Admitting: Emergency Medicine

## 2021-05-20 ENCOUNTER — Emergency Department (HOSPITAL_COMMUNITY): Payer: BLUE CROSS/BLUE SHIELD

## 2021-05-20 ENCOUNTER — Encounter (HOSPITAL_COMMUNITY): Payer: Self-pay | Admitting: *Deleted

## 2021-05-20 DIAGNOSIS — M79604 Pain in right leg: Secondary | ICD-10-CM | POA: Insufficient documentation

## 2021-05-20 DIAGNOSIS — R200 Anesthesia of skin: Secondary | ICD-10-CM | POA: Diagnosis not present

## 2021-05-20 DIAGNOSIS — Z5321 Procedure and treatment not carried out due to patient leaving prior to being seen by health care provider: Secondary | ICD-10-CM | POA: Insufficient documentation

## 2021-05-20 DIAGNOSIS — R202 Paresthesia of skin: Secondary | ICD-10-CM

## 2021-05-20 DIAGNOSIS — M7989 Other specified soft tissue disorders: Secondary | ICD-10-CM

## 2021-05-20 LAB — CBC
HCT: 34.4 % — ABNORMAL LOW (ref 36.0–46.0)
Hemoglobin: 10.9 g/dL — ABNORMAL LOW (ref 12.0–15.0)
MCH: 29.5 pg (ref 26.0–34.0)
MCHC: 31.7 g/dL (ref 30.0–36.0)
MCV: 93 fL (ref 80.0–100.0)
Platelets: 187 10*3/uL (ref 150–400)
RBC: 3.7 MIL/uL — ABNORMAL LOW (ref 3.87–5.11)
RDW: 13.9 % (ref 11.5–15.5)
WBC: 3.9 10*3/uL — ABNORMAL LOW (ref 4.0–10.5)
nRBC: 0 % (ref 0.0–0.2)

## 2021-05-20 LAB — COMPREHENSIVE METABOLIC PANEL
ALT: 12 U/L (ref 0–44)
AST: 13 U/L — ABNORMAL LOW (ref 15–41)
Albumin: 3.5 g/dL (ref 3.5–5.0)
Alkaline Phosphatase: 40 U/L (ref 38–126)
Anion gap: 6 (ref 5–15)
BUN: 14 mg/dL (ref 6–20)
CO2: 24 mmol/L (ref 22–32)
Calcium: 8.9 mg/dL (ref 8.9–10.3)
Chloride: 107 mmol/L (ref 98–111)
Creatinine, Ser: 0.88 mg/dL (ref 0.44–1.00)
GFR, Estimated: >60 mL/min (ref >60)
Glucose, Bld: 91 mg/dL (ref 70–99)
Potassium: 4.3 mmol/L (ref 3.5–5.1)
Sodium: 137 mmol/L (ref 135–145)
Total Bilirubin: 0.6 mg/dL (ref 0.3–1.2)
Total Protein: 6 g/dL — ABNORMAL LOW (ref 6.5–8.1)

## 2021-05-20 LAB — I-STAT BETA HCG BLOOD, ED (MC, WL, AP ONLY): I-stat hCG, quantitative: <5 m[IU]/mL (ref <5)

## 2021-05-20 NOTE — Progress Notes (Signed)
VASCULAR LAB    Right lower extremity venous duplex has been performed.  See CV proc for preliminary results.  Gave verbal results to Luciano Cutter, RN  Rosezetta Schlatter, Maurice Fotheringham, RVT 05/20/2021, 7:13 PM

## 2021-05-20 NOTE — ED Triage Notes (Signed)
The pt is c//o pain in her rt leg with numbness since Tuesday  she is afraid she has a blood clot   no previous history  she has ktws syndrome  lmp  one week ago

## 2021-05-20 NOTE — ED Notes (Signed)
PT going to the PEDS ED to check on her son

## 2021-05-21 NOTE — ED Notes (Signed)
PT Left emergency department

## 2021-05-21 NOTE — ED Notes (Signed)
PT didn't answer when called for recheck vitals

## 2021-06-13 ENCOUNTER — Ambulatory Visit: Payer: Medicaid Other | Admitting: Nurse Practitioner

## 2021-06-17 ENCOUNTER — Encounter (HOSPITAL_BASED_OUTPATIENT_CLINIC_OR_DEPARTMENT_OTHER): Payer: Self-pay | Admitting: Obstetrics and Gynecology

## 2021-06-17 ENCOUNTER — Ambulatory Visit
Admission: EM | Admit: 2021-06-17 | Discharge: 2021-06-17 | Disposition: A | Discharge status: Home / Self Care | Payer: BLUE CROSS/BLUE SHIELD

## 2021-06-17 ENCOUNTER — Other Ambulatory Visit: Payer: Self-pay

## 2021-06-17 ENCOUNTER — Emergency Department (HOSPITAL_BASED_OUTPATIENT_CLINIC_OR_DEPARTMENT_OTHER): Payer: BLUE CROSS/BLUE SHIELD

## 2021-06-17 ENCOUNTER — Emergency Department (HOSPITAL_BASED_OUTPATIENT_CLINIC_OR_DEPARTMENT_OTHER)
Admission: EM | Admit: 2021-06-17 | Discharge: 2021-06-17 | Disposition: A | Discharge status: Home / Self Care | Payer: BLUE CROSS/BLUE SHIELD | Attending: Emergency Medicine | Admitting: Emergency Medicine

## 2021-06-17 DIAGNOSIS — R1012 Left upper quadrant pain: Secondary | ICD-10-CM | POA: Diagnosis not present

## 2021-06-17 DIAGNOSIS — M545 Low back pain, unspecified: Secondary | ICD-10-CM

## 2021-06-17 DIAGNOSIS — Z87891 Personal history of nicotine dependence: Secondary | ICD-10-CM | POA: Insufficient documentation

## 2021-06-17 DIAGNOSIS — Z79899 Other long term (current) drug therapy: Secondary | ICD-10-CM | POA: Diagnosis not present

## 2021-06-17 DIAGNOSIS — R300 Dysuria: Secondary | ICD-10-CM

## 2021-06-17 DIAGNOSIS — R109 Unspecified abdominal pain: Secondary | ICD-10-CM

## 2021-06-17 LAB — COMPREHENSIVE METABOLIC PANEL
ALT: 7 U/L (ref 0–44)
AST: 11 U/L — ABNORMAL LOW (ref 15–41)
Albumin: 4 g/dL (ref 3.5–5.0)
Alkaline Phosphatase: 36 U/L — ABNORMAL LOW (ref 38–126)
Anion gap: 7 (ref 5–15)
BUN: 13 mg/dL (ref 6–20)
CO2: 26 mmol/L (ref 22–32)
Calcium: 8.8 mg/dL — ABNORMAL LOW (ref 8.9–10.3)
Chloride: 104 mmol/L (ref 98–111)
Creatinine, Ser: 0.84 mg/dL (ref 0.44–1.00)
GFR, Estimated: >60 mL/min (ref >60)
Glucose, Bld: 89 mg/dL (ref 70–99)
Potassium: 3.7 mmol/L (ref 3.5–5.1)
Sodium: 137 mmol/L (ref 135–145)
Total Bilirubin: 0.8 mg/dL (ref 0.3–1.2)
Total Protein: 6.4 g/dL — ABNORMAL LOW (ref 6.5–8.1)

## 2021-06-17 LAB — URINALYSIS, ROUTINE W REFLEX MICROSCOPIC
Bilirubin Urine: NEGATIVE
Glucose, UA: NEGATIVE mg/dL
Ketones, ur: NEGATIVE mg/dL
Leukocytes,Ua: NEGATIVE
Nitrite: NEGATIVE
Protein, ur: NEGATIVE mg/dL
Specific Gravity, Urine: 1.027 (ref 1.005–1.030)
pH: 6 (ref 5.0–8.0)

## 2021-06-17 LAB — CBC WITH DIFFERENTIAL/PLATELET
Abs Immature Granulocytes: 0.02 10*3/uL (ref 0.00–0.07)
Basophils Absolute: 0.1 10*3/uL (ref 0.0–0.1)
Basophils Relative: 2 %
Eosinophils Absolute: 0.2 10*3/uL (ref 0.0–0.5)
Eosinophils Relative: 4 %
HCT: 36 % (ref 36.0–46.0)
Hemoglobin: 11.6 g/dL — ABNORMAL LOW (ref 12.0–15.0)
Immature Granulocytes: 0 %
Lymphocytes Relative: 18 %
Lymphs Abs: 0.8 10*3/uL (ref 0.7–4.0)
MCH: 29.5 pg (ref 26.0–34.0)
MCHC: 32.2 g/dL (ref 30.0–36.0)
MCV: 91.6 fL (ref 80.0–100.0)
Monocytes Absolute: 0.3 10*3/uL (ref 0.1–1.0)
Monocytes Relative: 7 %
Neutro Abs: 3.2 10*3/uL (ref 1.7–7.7)
Neutrophils Relative %: 69 %
Platelets: 196 10*3/uL (ref 150–400)
RBC: 3.93 MIL/uL (ref 3.87–5.11)
RDW: 13.9 % (ref 11.5–15.5)
WBC: 4.5 10*3/uL (ref 4.0–10.5)
nRBC: 0 % (ref 0.0–0.2)

## 2021-06-17 LAB — LIPASE, BLOOD: Lipase: 11 U/L (ref 11–51)

## 2021-06-17 LAB — POCT URINALYSIS DIP (MANUAL ENTRY)
Bilirubin, UA: NEGATIVE
Glucose, UA: NEGATIVE mg/dL
Ketones, POC UA: NEGATIVE mg/dL
Leukocytes, UA: NEGATIVE
Nitrite, UA: NEGATIVE
Protein Ur, POC: NEGATIVE mg/dL
Spec Grav, UA: >=1.03 — AB (ref 1.010–1.025)
Urobilinogen, UA: 0.2 E.U./dL
pH, UA: 6.5 (ref 5.0–8.0)

## 2021-06-17 LAB — PREGNANCY, URINE: Preg Test, Ur: NEGATIVE

## 2021-06-17 MED ORDER — DICYCLOMINE HCL 20 MG PO TABS: 20.0000 mg | ORAL_TABLET | Freq: Two times a day (BID) | ORAL | 0 refills | Status: DC

## 2021-06-17 MED ORDER — ALUM & MAG HYDROXIDE-SIMETH 200-200-20 MG/5ML PO SUSP
30.0000 mL | Freq: Once | ORAL | Status: DC
  Filled 2021-06-17: qty 30

## 2021-06-17 MED ORDER — ONDANSETRON HCL 4 MG/2ML IJ SOLN
4.0000 mg | Freq: Once | INTRAMUSCULAR | Status: AC
Start: 2021-06-17 — End: 2021-06-17
  Administered 2021-06-17: 4 mg via INTRAVENOUS
  Filled 2021-06-17: qty 2

## 2021-06-17 MED ORDER — SODIUM CHLORIDE 0.9 % IV BOLUS
1000.0000 mL | Freq: Once | INTRAVENOUS | Status: AC
Start: 2021-06-17 — End: 2021-06-17
  Administered 2021-06-17: 1000 mL via INTRAVENOUS

## 2021-06-17 MED ORDER — LIDOCAINE VISCOUS HCL 2 % MT SOLN
15.0000 mL | Freq: Once | OROMUCOSAL | Status: DC
  Filled 2021-06-17: qty 15

## 2021-06-17 MED ORDER — MORPHINE SULFATE (PF) 4 MG/ML IV SOLN
4.0000 mg | Freq: Once | INTRAVENOUS | Status: AC
  Administered 2021-06-17: 4 mg via INTRAVENOUS
  Filled 2021-06-17: qty 1

## 2021-06-17 MED ORDER — ONDANSETRON 4 MG PO TBDP: 4.0000 mg | ORAL_TABLET | Freq: Three times a day (TID) | ORAL | 0 refills | Status: DC | PRN

## 2021-06-17 NOTE — Discharge Instructions (Signed)
Take Zofran every 8 hours as needed for nausea and vomiting. Take the Bentyl twice daily for the next 2 weeks.  I would follow-up with a primary care doctor for reevaluation.  As we discussed, your work-up was reassuring that we did not come to a definitive answer for what is causing your pain.  You should follow-up with a primary care doctor for additional evaluation as needed.

## 2021-06-17 NOTE — ED Provider Notes (Signed)
[facility] EMERGENCY DEPT Provider Note   CSN: [id number] Arrival date & time: 06/17/21  1419     History Chief Complaint  Patient presents with   Flank Pain    Frances Reed is a 30 y.o. female.   Flank Pain Associated symptoms include abdominal pain. Pertinent negatives include no chest pain and no shortness of breath.   Patient with medical history of 3 prior kidney stones and KTWS presents with left flank pain.  The pain started acutely on Thursday, has been ongoing for the last 4 days.  Has been intermittent, 8 out of 10, located on the left flank with radiation to the left upper and lower quadrants.  She has tried Tylenol which has not improved the pain.  She does endorse some dysuria, denies any hematuria.  She is nauseated but not vomiting.  Denies any known aggravating factors.  She went to urgent care earlier today and was advised to go to the ED for additional work-up.  Past Medical History:  Diagnosis Date   Generalized anxiety disorder 06/13/2015   History of kidney stones    Klippel-Trenaunay-Weber syndrome    Sleep apnea     Patient Active Problem List   Diagnosis Date Noted   Palpitations 10/20/2019   Precordial chest pain 09/20/2019   Educated about COVID-19 virus infection 09/20/2019   LGSIL on Pap smear of cervix 08/06/2019   Pain of right lower leg 10/18/2015   Generalized anxiety disorder 06/13/2015   Carpal tunnel syndrome 01/18/2015   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    Past Surgical History:  Procedure Laterality Date   FOOT SURGERY Bilateral    LAPAROSCOPY N/A 05/21/2019   Procedure: LAPAROSCOPY DIAGNOSTIC, Left Pelvic Cystectomy ;  Surgeon: Hermina Staggers, MD;  Location: [facility] OR;  Service: Gynecology;  Laterality: N/A;   SKIN GRAFT Right 2000   abdominal graft for foot   THROAT SURGERY     TONSILLECTOMY       OB History     Gravida  1   Para  1   Term  1   Preterm      AB      Living  1       SAB      IAB      Ectopic      Multiple      Live Births  1           No family history on file.  Social History   Tobacco Use   Smoking status: Never    Passive exposure: Past   Smokeless tobacco: Former    Types: Chew    Quit date: 01/2021  Vaping Use   Vaping Use: Some days   Substances: Nicotine, Flavoring  Substance Use Topics   Alcohol use: Yes    Alcohol/week: 3.0 standard drinks    Types: 3 Cans of beer per week    Comment: per week   Drug use: No    Home Medications Prior to Admission medications   Medication Sig Start Date End Date Taking? Authorizing Provider  albuterol (VENTOLIN HFA) 108 (90 Base) MCG/ACT inhaler 1 puff every 4 (four) hours. 03/08/21   [provider]  clonazePAM (KLONOPIN) 0.5 MG tablet 1 tablet 03/08/21   [provider]  gabapentin (NEURONTIN) 100 MG capsule TAKE 1 CAPSULE BY MOUTH TWICE A DAY 06/24/17   Ellyn Hack, MD  gabapentin (NEURONTIN) 100 MG capsule Take 100 mg by mouth 2 (two) times daily.  [provider]  HYDROcodone-acetaminophen (NORCO/VICODIN) 5-325 MG tablet Take 1-2 tablets by mouth every 6 (six) hours as needed for moderate pain or severe pain. 03/13/21   Domenick Gong, MD  ibuprofen (ADVIL) 600 MG tablet Take 1 tablet (600 mg total) by mouth every 6 (six) hours as needed. 03/13/21   Domenick Gong, MD  Levothyroxine Sodium (SYNTHROID PO) Take by mouth.    [provider]  PARoxetine (PAXIL) 10 MG tablet Take 10 mg by mouth daily. Patient not taking: Reported on 09/17/2020    [provider]  PARoxetine (PAXIL) 10 MG tablet Take 10 mg by mouth daily. Patient not taking: Reported on 09/17/2020    [provider]  VRAYLAR 1.5 MG capsule Take 1.5 mg by mouth daily. 05/28/21   [provider]    Allergies    Coconut oil and Motrin [ibuprofen]  Review of Systems   Review of Systems  Constitutional:  Negative for chills and fever.  HENT:   Negative for ear pain and sore throat.   Eyes:  Negative for pain and visual disturbance.  Respiratory:  Negative for cough and shortness of breath.   Cardiovascular:  Negative for chest pain and palpitations.  Gastrointestinal:  Positive for abdominal pain and nausea. Negative for vomiting.  Genitourinary:  Positive for dysuria and flank pain. Negative for hematuria and vaginal discharge.  Musculoskeletal:  Negative for arthralgias and back pain.  Skin:  Negative for color change and rash.  Neurological:  Negative for seizures and syncope.  All other systems reviewed and are negative.  Physical Exam Updated Vital Signs BP 130/70 (BP Location: Right Arm)   Pulse 77   Temp 99.1 F (37.3 C) (Oral)   Resp 18   Ht 5\' 4"  (1.626 m)   Wt 79.8 kg   LMP 06/03/2021 (Approximate)   SpO2 100%   BMI 30.21 kg/m   Physical Exam Vitals and nursing note reviewed.  Constitutional:      General: She is not in acute distress.    Appearance: She is well-developed.     Comments: Patient appears uncomfortable  HENT:     Head: Normocephalic and atraumatic.  Eyes:     Conjunctiva/sclera: Conjunctivae normal.  Cardiovascular:     Rate and Rhythm: Normal rate and regular rhythm.     Heart sounds: No murmur heard. Pulmonary:     Effort: Pulmonary effort is normal. No respiratory distress.     Breath sounds: Normal breath sounds.  Abdominal:     Palpations: Abdomen is soft.     Tenderness: There is abdominal tenderness. There is left CVA tenderness.     Comments: Abdomen is soft, there is positive left CVA tenderness as well as left upper quadrant and lower quadrant tenderness.  No rigidity or guarding  Musculoskeletal:     Cervical back: Neck supple.  Skin:    General: Skin is warm and dry.  Neurological:     Mental Status: She is alert.    ED Results / Procedures / Treatments   Labs (all labs ordered are listed, but only abnormal results are displayed) Labs Reviewed  CBC WITH  DIFFERENTIAL/PLATELET  COMPREHENSIVE METABOLIC PANEL  LIPASE, BLOOD  URINALYSIS, ROUTINE W REFLEX MICROSCOPIC  PREGNANCY, URINE    EKG None  Radiology No results found.  Procedures Procedures   Medications Ordered in ED Medications  sodium chloride 0.9 % bolus 1,000 mL (has no administration in time range)  ondansetron (ZOFRAN) injection 4 mg (has no administration in time  range)  morphine 4 MG/ML injection 4 mg (has no administration in time range)    ED Course  I have reviewed the triage vital signs and the nursing notes.  Pertinent labs & imaging results that were available during my care of the patient were reviewed by me and considered in my medical decision making (see chart for details).    MDM Rules/Calculators/A&P                           Patient vitals are stable, she is not febrile.  No leukocytosis, doubt a septic stone.  Urine is unremarkable although there is a small amount of hemoglobin, no findings to some of the UTI.  Given lack of UTI, lack of fever doubt this is pyelonephritis.  Nephrolithiasis is on the differential, we will proceed with CT renal study.  No recent trauma, doubt spinal lack.  Patient is not having any rectal bleeding, recent tenderness in the left lower quadrant so it could be consistent with a diverticulitis picture.   No leukocytosis, no anemia.  Patient does not have any gross electrolyte derangement, no LFT elevation concerning for biliary obstructive process.  Patient's pain improved with medicine, serial abdominal exams have been reassuring.  No peritoneal signs.  CT is negative for any acute pathology, although I do not have an identifiable etiology, do not feel that the patient requires additional work-up.  Her vitals are stable, her symptoms have improved.  We will have her follow-up with her PCP outpatient and return if symptoms worsen.  Patient discharged in stable condition.   Final Clinical Impression(s) / ED Diagnoses Final  diagnoses:  None    Rx / DC Orders ED Discharge Orders     None        Theron Arista, PA-C 06/17/21 1815    Alvira Monday, MD 06/18/21 1836

## 2021-06-17 NOTE — ED Provider Notes (Signed)
[name]    CSN: [id number] Arrival date & time: 06/17/21  1118      History   Chief Complaint Chief Complaint  Patient presents with   Abdominal Pain   Nausea    HPI Frances Reed is a 30 y.o. female.   Patient presents with 3-day history of left-sided abdominal pain that radiates to the left lower back.  Patient reports that nausea without vomiting started today.  Denies any diarrhea.  Denies any blood in stool.  Is having regular bowel movements.  No aggravating factors to pain.  Pain is intermittent and described as sharp.  Pain is rated 8/10 on pain scale.  Denies any fevers or upper respiratory symptoms.  Denies any urinary frequency but does report urinary burning that started today.  Denies any hematuria, pelvic pain, irregular vaginal bleeding.  Denies any known exposure to STD.  Has had unprotected sexual intercourse with a female recently and has no concerns for pregnancy.  Denies chest pain or shortness of breath.  Patient does report history of kidney stones with obstruction and states that this "feels similar".   Abdominal Pain  Past Medical History:  Diagnosis Date   Generalized anxiety disorder 06/13/2015   History of kidney stones    Klippel-Trenaunay-Weber syndrome    Sleep apnea     Patient Active Problem List   Diagnosis Date Noted   Palpitations 10/20/2019   Precordial chest pain 09/20/2019   Educated about COVID-19 virus infection 09/20/2019   LGSIL on Pap smear of cervix 08/06/2019   Pain of right lower leg 10/18/2015   Generalized anxiety disorder 06/13/2015   Carpal tunnel syndrome 01/18/2015   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    Past Surgical History:  Procedure Laterality Date   FOOT SURGERY Bilateral    LAPAROSCOPY N/A 05/21/2019   Procedure: LAPAROSCOPY DIAGNOSTIC, Left Pelvic Cystectomy ;  Surgeon: Hermina Staggers, MD;  Location: [facility] OR;  Service: Gynecology;  Laterality: N/A;   SKIN GRAFT Right 2000    abdominal graft for foot   THROAT SURGERY     TONSILLECTOMY      OB History     Gravida  1   Para  1   Term  1   Preterm      AB      Living  1      SAB      IAB      Ectopic      Multiple      Live Births  1            Home Medications    Prior to Admission medications   Medication Sig Start Date End Date Taking? Authorizing Provider  clonazePAM (KLONOPIN) 0.5 MG tablet 1 tablet 03/08/21  Yes [provider]  albuterol (VENTOLIN HFA) 108 (90 Base) MCG/ACT inhaler 1 puff every 4 (four) hours. 03/08/21   [provider]  gabapentin (NEURONTIN) 100 MG capsule TAKE 1 CAPSULE BY MOUTH TWICE A DAY 06/24/17   Ellyn Hack, MD  gabapentin (NEURONTIN) 100 MG capsule Take 100 mg by mouth 2 (two) times daily.    [provider]  HYDROcodone-acetaminophen (NORCO/VICODIN) 5-325 MG tablet Take 1-2 tablets by mouth every 6 (six) hours as needed for moderate pain or severe pain. 03/13/21   Domenick Gong, MD  ibuprofen (ADVIL) 600 MG tablet Take 1 tablet (600 mg total) by mouth every 6 (six) hours as needed. 03/13/21   Domenick Gong, MD  Levothyroxine Sodium (SYNTHROID PO)  Take by mouth.    [provider]  PARoxetine (PAXIL) 10 MG tablet Take 10 mg by mouth daily. Patient not taking: Reported on 09/17/2020    [provider]  PARoxetine (PAXIL) 10 MG tablet Take 10 mg by mouth daily. Patient not taking: Reported on 09/17/2020    [provider]  VRAYLAR 1.5 MG capsule Take 1.5 mg by mouth daily. 05/28/21   [provider]    Family History History reviewed. No pertinent family history.  Social History Social History   Tobacco Use   Smoking status: Never   Smokeless tobacco: Current    Types: Chew  Vaping Use   Vaping Use: Never used  Substance Use Topics   Alcohol use: Yes    Alcohol/week: 3.0 standard drinks    Types: 3 Cans of beer per week    Comment: per week   Drug use: No      Allergies   Coconut oil and Motrin [ibuprofen]   Review of Systems Review of Systems Per HPI  Physical Exam Triage Vital Signs ED Triage Vitals  Enc Vitals Group     BP 06/17/21 1245 114/79     Pulse Rate 06/17/21 1245 75     Resp 06/17/21 1245 20     Temp 06/17/21 1245 97.9 F (36.6 C)     Temp Source 06/17/21 1245 Oral     SpO2 06/17/21 1245 99 %     Weight --      Height --      Head Circumference --      Peak Flow --      Pain Score 06/17/21 1247 8     Pain Loc --      Pain Edu? --      Excl. in GC? --    No data found.  Updated Vital Signs BP 114/79 (BP Location: Left Arm)   Pulse 75   Temp 97.9 F (36.6 C) (Oral)   Resp 20   LMP 06/03/2021 (Approximate)   SpO2 99%   Visual Acuity Right Eye Distance:   Left Eye Distance:   Bilateral Distance:    Right Eye Near:   Left Eye Near:    Bilateral Near:     Physical Exam Constitutional:      General: She is not in acute distress.    Appearance: Normal appearance. She is not toxic-appearing or diaphoretic.  HENT:     Head: Normocephalic and atraumatic.  Eyes:     Extraocular Movements: Extraocular movements intact.     Conjunctiva/sclera: Conjunctivae normal.  Cardiovascular:     Rate and Rhythm: Normal rate and regular rhythm.     Pulses: Normal pulses.     Heart sounds: Normal heart sounds.  Pulmonary:     Effort: Pulmonary effort is normal. No respiratory distress.     Breath sounds: Normal breath sounds.  Abdominal:     General: Bowel sounds are normal. There is no distension.     Palpations: Abdomen is soft.     Tenderness: There is abdominal tenderness in the left lower quadrant. There is no guarding or rebound. Negative signs include Murphy's sign, Rovsing's sign, McBurney's sign, psoas sign and obturator sign.  Skin:    General: Skin is warm and dry.  Neurological:     General: No focal deficit present.     Mental Status: She is alert and oriented to person, place, and time. Mental  status is at baseline.  Psychiatric:  Mood and Affect: Mood normal.        Behavior: Behavior normal.        Thought Content: Thought content normal.        Judgment: Judgment normal.     UC Treatments / Results  Labs (all labs ordered are listed, but only abnormal results are displayed) Labs Reviewed  POCT URINALYSIS DIP (MANUAL ENTRY) - Abnormal; Notable for the following components:      Result Value   Spec Grav, UA >=1.030 (*)    Blood, UA trace-intact (*)    All other components within normal limits    EKG   Radiology No results found.  Procedures Procedures (including critical care time)  Medications Ordered in UC Medications - No data to display  Initial Impression / Assessment and Plan / UC Course  I have reviewed the triage vital signs and the nursing notes.  Pertinent labs & imaging results that were available during my care of the patient were reviewed by me and considered in my medical decision making (see chart for details).     Urinalysis not showing signs of urinary tract infection.  There is mild hematuria on urinalysis that could indicate kidney stone.  Highly suspicious that patient's pain is related to kidney stone, although patient is having severe lower abdominal pain as well that warrants further investigation.  Advised patient that she will need to go to the hospital for further imaging and evaluation and management.  No concerns for pregnancy as patient has had sexual intercourse with a female only so no need for urine pregnancy test.  Patient was agreeable plan.  Vital signs stable at discharge.  Agree with patient self transport to the hospital. Final Clinical Impressions(s) / UC Diagnoses   Final diagnoses:  Continuous severe abdominal pain  Acute left-sided low back pain without sciatica  Dysuria     Discharge Instructions      Please go to the emergency department as soon as you leave urgent care for further evaluation and  management.     ED Prescriptions   None    PDMP not reviewed this encounter.   Gustavus Bryant, [place] 06/17/21 [id number]

## 2021-06-17 NOTE — ED Triage Notes (Signed)
Patient reports left sided flank pain that radiates to abdomen x3 days. Patient reports decreased appetite and nausea

## 2021-06-17 NOTE — ED Triage Notes (Signed)
3 day h/o left sided abdominal pain that radiates to her back and onset today of nausea with decreased appetite. Since the onset pain has increased in frequency and intensity. Denies emesis, diarrhea and constipation.  No hematuria.

## 2021-06-17 NOTE — Discharge Instructions (Signed)
Please go to the emergency department as soon as you leave urgent care for further evaluation and management. ?

## 2021-06-28 ENCOUNTER — Other Ambulatory Visit (HOSPITAL_BASED_OUTPATIENT_CLINIC_OR_DEPARTMENT_OTHER): Payer: Self-pay | Admitting: Emergency Medicine

## 2021-06-29 ENCOUNTER — Other Ambulatory Visit (HOSPITAL_BASED_OUTPATIENT_CLINIC_OR_DEPARTMENT_OTHER): Payer: Self-pay | Admitting: Emergency Medicine

## 2021-06-29 DIAGNOSIS — R1032 Left lower quadrant pain: Secondary | ICD-10-CM

## 2021-07-03 ENCOUNTER — Ambulatory Visit (HOSPITAL_BASED_OUTPATIENT_CLINIC_OR_DEPARTMENT_OTHER)
Admission: RE | Admit: 2021-07-03 | Discharge: 2021-07-03 | Disposition: A | Discharge status: Home / Self Care | Payer: BLUE CROSS/BLUE SHIELD | Source: Ambulatory Visit | Attending: Nurse Practitioner | Admitting: Nurse Practitioner

## 2021-07-03 ENCOUNTER — Other Ambulatory Visit: Payer: Self-pay

## 2021-07-03 DIAGNOSIS — R1032 Left lower quadrant pain: Secondary | ICD-10-CM | POA: Insufficient documentation

## 2021-10-07 ENCOUNTER — Emergency Department (HOSPITAL_BASED_OUTPATIENT_CLINIC_OR_DEPARTMENT_OTHER)
Admission: EM | Admit: 2021-10-07 | Discharge: 2021-10-07 | Disposition: A | Discharge status: Home / Self Care | Payer: BLUE CROSS/BLUE SHIELD | Attending: Emergency Medicine | Admitting: Emergency Medicine

## 2021-10-07 ENCOUNTER — Encounter (HOSPITAL_BASED_OUTPATIENT_CLINIC_OR_DEPARTMENT_OTHER): Payer: Self-pay | Admitting: Emergency Medicine

## 2021-10-07 DIAGNOSIS — Z87442 Personal history of urinary calculi: Secondary | ICD-10-CM | POA: Insufficient documentation

## 2021-10-07 DIAGNOSIS — X500XXA Overexertion from strenuous movement or load, initial encounter: Secondary | ICD-10-CM | POA: Insufficient documentation

## 2021-10-07 DIAGNOSIS — Y990 Civilian activity done for income or pay: Secondary | ICD-10-CM | POA: Insufficient documentation

## 2021-10-07 DIAGNOSIS — M545 Low back pain, unspecified: Secondary | ICD-10-CM | POA: Insufficient documentation

## 2021-10-07 MED ORDER — PREDNISONE 10 MG (21) PO TBPK: ORAL_TABLET | Freq: Every day | ORAL | 0 refills | Status: DC

## 2021-10-07 MED ORDER — LIDOCAINE 5 % EX PTCH: 1.0000 | MEDICATED_PATCH | CUTANEOUS | 0 refills | Status: DC

## 2021-10-07 MED ORDER — DICLOFENAC SODIUM 1 % EX GEL: 4.0000 g | Freq: Four times a day (QID) | CUTANEOUS | 0 refills | Status: DC

## 2021-10-07 NOTE — Discharge Instructions (Signed)

## 2021-10-07 NOTE — ED Triage Notes (Signed)
Pt reports she hurt her back at work today. 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 Pain is in lower back, worse on R side. Pt ambulatory to triage.

## 2021-10-07 NOTE — ED Provider Notes (Signed)
[facility] EMERGENCY DEPARTMENT Provider Note   CSN: [id number] Arrival date & time: 10/07/21  1532     History  Chief Complaint  Patient presents with   Back Pain    Sameka Bagent is a 31 y.o. female.   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  She denies any numbness or weakness or difficulty walking.  She states that the pain is achy constant and 6/10.  Nonradiating although she states that she does have some discomfort in her right buttocks and upper thigh as well.  She denies any chest pain difficulty breathing.  No fevers no lightheadedness or dizziness.  Denies any history of recreational drug use she is not a cancer patient.      Home Medications Prior to Admission medications   Medication Sig Start Date End Date Taking? Authorizing Provider  diclofenac Sodium (VOLTAREN) 1 % GEL Apply 4 g topically 4 (four) times daily. 10/07/21  Yes Sahily Biddle S, PA  lidocaine (LIDODERM) 5 % Place 1 patch onto the skin daily. Remove & Discard patch within 12 hours or as directed by MD 10/07/21  Yes Delvin Hedeen, Rodrigo Ran, PA  predniSONE (STERAPRED UNI-PAK 21 TAB) 10 MG (21) TBPK tablet Take by mouth daily. Take 6 tabs by mouth daily  for 2 days, then 5 tabs for 2 days, then 4 tabs for 2 days, then 3 tabs for 2 days, 2 tabs for 2 days, then 1 tab by mouth daily for 2 days 10/07/21  Yes Fernie Grimm S, PA  albuterol (VENTOLIN HFA) 108 (90 Base) MCG/ACT inhaler 1 puff every 4 (four) hours. 03/08/21   [provider]  clonazePAM Scarlette Calico) 0.5 MG tablet 1 tablet 03/08/21   [provider]  dicyclomine (BENTYL) 20 MG tablet Take 1 tablet (20 mg total) by mouth 2 (two) times daily. 06/17/21   Theron Arista, PA-C  gabapentin (NEURONTIN) 100 MG capsule TAKE 1  CAPSULE BY MOUTH TWICE A DAY 06/24/17   Ellyn Hack, MD  gabapentin (NEURONTIN) 100 MG capsule Take 100 mg by mouth 2 (two) times daily.    [provider]  HYDROcodone-acetaminophen (NORCO/VICODIN) 5-325 MG tablet Take 1-2 tablets by mouth every 6 (six) hours as needed for moderate pain or severe pain. 03/13/21   Domenick Gong, MD  ibuprofen (ADVIL) 600 MG tablet Take 1 tablet (600 mg total) by mouth every 6 (six) hours as needed. 03/13/21   Domenick Gong, MD  Levothyroxine Sodium (SYNTHROID PO) Take by mouth.    [provider]  ondansetron (ZOFRAN ODT) 4 MG disintegrating tablet Take 1 tablet (4 mg total) by mouth every 8 (eight) hours as needed for nausea or vomiting. 06/17/21   Theron Arista, PA-C  PARoxetine (PAXIL) 10 MG tablet Take 10 mg by mouth daily. Patient not taking: Reported on 09/17/2020    [provider]  PARoxetine (PAXIL) 10 MG tablet Take 10 mg by mouth daily. Patient not taking: Reported on 09/17/2020    [provider]  VRAYLAR 1.5 MG capsule Take 1.5 mg by mouth daily. 05/28/21   [provider]      Allergies    Coconut oil and Motrin [ibuprofen]    Review of Systems   Review of Systems  Musculoskeletal:  Positive for back  pain.   Physical Exam Updated Vital Signs BP 128/81 (BP Location: Left Arm)    Pulse 84    Temp 98.3 F (36.8 C) (Oral)    Resp 18    SpO2 100%  Physical Exam Vitals and nursing note reviewed.  Constitutional:      General: She is not in acute distress. HENT:     Head: Normocephalic and atraumatic.     Nose: Nose normal.  Eyes:     General: No scleral icterus. Cardiovascular:     Rate and Rhythm: Normal rate and regular rhythm.     Pulses: Normal pulses.     Heart sounds: Normal heart sounds.  Pulmonary:     Effort: Pulmonary effort is normal. No respiratory distress.     Breath sounds: No wheezing.  Abdominal:     Palpations: Abdomen is soft.     Tenderness: There is no abdominal  tenderness. There is no guarding or rebound.  Musculoskeletal:     Cervical back: Normal range of motion.     Right lower leg: No edema.     Left lower leg: No edema.     Comments: Diffuse low back tenderness palpation.  Some muscular tenderness to palpation of the right lumbar musculature.  Symptoms palpation of right gluteal muscle  Skin:    General: Skin is warm and dry.     Capillary Refill: Capillary refill takes less than 2 seconds.  Neurological:     Mental Status: She is alert. Mental status is at baseline.  Psychiatric:        Mood and Affect: Mood normal.        Behavior: Behavior normal.    ED Results / Procedures / Treatments   Labs (all labs ordered are listed, but only abnormal results are displayed) Labs Reviewed - No data to display  EKG None  Radiology No results found.  Procedures Procedures    Medications Ordered in ED Medications - No data to display  ED Course/ Medical Decision Making/ A&P                           Medical Decision Making Risk Prescription drug management.   Patient is a 31 year old female presented to ER today with atraumatic low back pain.  She has a history of low back pain but states this is significantly worse than prior.  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  She denies any numbness or weakness she has been able to walk normally she has no saddle anesthesia  Broad differential for back pain considered includes malignancy, disc herniation, spinal epidural abscess, spinal fracture, cauda equina, pyelonephritis, kidney stone, AAA, AD, pancreatitis, PE and PTX.   History without symptoms of urinary or stool retention or incontinence, neurologic changes such as sensation change or weakness lower extremities, coagulopathy or blood thinner use, is not elderly or with history of osteoporosis, denies any history of cancer, fever, IV drug use, weight changes (unexplained), or prolonged  steroid use.   Physical exam most consistent with muscular strain. Doubt cauda equina or disc herniation d/t lack of saddle anesthesia/bowel or bladder incontinence or urinary retention, normal gait and reassuring physical examination without neurologic deficits.   History is not supportive of kidney stone, AAA, AD, pancreatitis, PE or PTX. Patient has no CVA tenderness or urinary sx to suggest pyelonephritis or kidney stone.   Will manage patient  conservatively at this time. NSAIDs, back exercises/stretches, heat therapy and follow up with PCP if symptoms do not resolve in 3-4 weeks. Patient offered muscle relaxer for comfort at night. Counseled on need to return to ED for fever, worsening or concerning symptoms. Patient agreeable to plan and states understanding of follow up plans and return precautions.      Vitals WNL at time of discharge and patient is no acute distress.  No red flag symptoms.  No focal midline tenderness of L-spine.  Will provide patient with Lidoderm patches, Voltaren gel, short course of prednisone given that she does endorse some radicular symptoms.  We will follow-up with PCP.  Return precautions given.  Final Clinical Impression(s) / ED Diagnoses Final diagnoses:  Acute right-sided low back pain without sciatica    Rx / DC Orders ED Discharge Orders          Ordered    predniSONE (STERAPRED UNI-PAK 21 TAB) 10 MG (21) TBPK tablet  Daily        10/07/21 1555    lidocaine (LIDODERM) 5 %  Every 24 hours        10/07/21 1556    diclofenac Sodium (VOLTAREN) 1 % GEL  4 times daily        10/07/21 1556              Gailen Shelter, [place] 10/07/21 1603    Virgina Norfolk, DO 10/07/21 1627

## 2022-01-17 ENCOUNTER — Other Ambulatory Visit: Payer: Self-pay | Admitting: Family Medicine

## 2022-01-17 DIAGNOSIS — M7989 Other specified soft tissue disorders: Secondary | ICD-10-CM

## 2022-01-18 ENCOUNTER — Ambulatory Visit
Admission: RE | Admit: 2022-01-18 | Discharge: 2022-01-18 | Disposition: A | Discharge status: Home / Self Care | Payer: BLUE CROSS/BLUE SHIELD | Source: Ambulatory Visit | Attending: Family Medicine | Admitting: Family Medicine

## 2022-01-18 DIAGNOSIS — M7989 Other specified soft tissue disorders: Secondary | ICD-10-CM

## 2022-01-24 ENCOUNTER — Other Ambulatory Visit: Payer: BLUE CROSS/BLUE SHIELD

## 2022-01-24 ENCOUNTER — Other Ambulatory Visit: Payer: Self-pay | Admitting: Student

## 2022-01-24 DIAGNOSIS — G5612 Other lesions of median nerve, left upper limb: Secondary | ICD-10-CM

## 2022-02-16 ENCOUNTER — Other Ambulatory Visit: Payer: BLUE CROSS/BLUE SHIELD

## 2022-02-21 ENCOUNTER — Other Ambulatory Visit: Payer: BLUE CROSS/BLUE SHIELD

## 2022-04-18 ENCOUNTER — Ambulatory Visit
Admission: EM | Admit: 2022-04-18 | Discharge: 2022-04-18 | Disposition: A | Discharge status: Other Acute Inpatient Hospital | Payer: BLUE CROSS/BLUE SHIELD | Attending: Family Medicine | Admitting: Family Medicine

## 2022-04-18 ENCOUNTER — Other Ambulatory Visit: Payer: Self-pay

## 2022-04-18 ENCOUNTER — Ambulatory Visit: Payer: BLUE CROSS/BLUE SHIELD

## 2022-04-18 DIAGNOSIS — R42 Dizziness and giddiness: Secondary | ICD-10-CM | POA: Diagnosis not present

## 2022-04-18 LAB — POCT FASTING CBG KUC MANUAL ENTRY: POCT Glucose (KUC): 100 mg/dL — AB (ref 70–99)

## 2022-04-18 MED ORDER — SODIUM CHLORIDE 0.9 % IV SOLN: Freq: Once | INTRAVENOUS | Status: AC

## 2022-04-18 MED ORDER — ONDANSETRON HCL 4 MG/2ML IJ SOLN
4.0000 mg | Freq: Once | INTRAMUSCULAR | Status: AC
  Administered 2022-04-18: 4 mg via INTRAVENOUS

## 2022-04-18 NOTE — ED Provider Notes (Addendum)
[name]    CSN: [id number] Arrival date & time: 04/18/22  1939      History   Chief Complaint Chief Complaint  Patient presents with   Hypotension    HPI Frances Reed is a 31 y.o. female.   HPI Here for lightheadedness and sometimes some vertigo.  This has been going on for a few months.  She also has a chest pressure that is significant, though not sharp.  She has had several episodes where she passed out or had nearly passed out  When she was seen at [facility], she had possible pulmonary edema on a chest x-ray.  Earlier this month she had an episode of syncope and loss approximately 90 minutes of time.  No head injury.  No fever or chills.  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   Pain of right lower leg 10/18/2015   Generalized anxiety disorder 06/13/2015   Carpal tunnel syndrome 01/18/2015   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    Past Surgical History:  Procedure Laterality Date   FOOT SURGERY Bilateral    LAPAROSCOPY N/A 05/21/2019   Procedure: LAPAROSCOPY DIAGNOSTIC, Left Pelvic Cystectomy ;  Surgeon: Hermina Staggers, MD;  Location: [facility] OR;  Service: Gynecology;  Laterality: N/A;   SKIN GRAFT Right 2000   abdominal graft for foot   THROAT SURGERY     TONSILLECTOMY      OB History     Gravida  1   Para  1   Term  1   Preterm      AB      Living  1      SAB      IAB      Ectopic      Multiple      Live Births  1            Home  Medications    Prior to Admission medications   Medication Sig Start Date End Date Taking? Authorizing Provider  lamoTRIgine (LAMICTAL) 200 MG tablet 2 tablets 11/06/21  Yes [provider]  levothyroxine (SYNTHROID) 50 MCG tablet 1 tablet in the morning on an empty stomach Orally Once a day for 90 day(s) 10/18/19  Yes [provider]  albuterol (VENTOLIN HFA) 108 (90 Base) MCG/ACT inhaler 1 puff every 4 (four) hours. 03/08/21   [provider]  clonazePAM (KLONOPIN) 0.5 MG tablet 1 tablet 03/08/21   [provider]  diclofenac (VOLTAREN) 75 MG EC tablet Take 75 mg by mouth 2 (two) times daily. 04/03/22   [provider]  diclofenac Sodium (VOLTAREN) 1 % GEL Apply 4 g topically 4 (four) times daily. 10/07/21   Gailen Shelter, PA  gabapentin (NEURONTIN) 100 MG capsule TAKE 1 CAPSULE BY MOUTH TWICE A DAY 06/24/17   Ellyn Hack, MD  gabapentin (NEURONTIN) 100 MG capsule Take 100 mg by mouth 2 (two) times daily.    [provider]  gabapentin (NEURONTIN) 100 MG  capsule Take 2 capsules by mouth daily.    [provider]  Levothyroxine Sodium (SYNTHROID PO) Take by mouth.    [provider]  lidocaine (LIDODERM) 5 % Place 1 patch onto the skin daily. Remove & Discard patch within 12 hours or as directed by MD 10/07/21   Gailen Shelter, PA  PARoxetine (PAXIL) 10 MG tablet Take 10 mg by mouth daily. Patient not taking: Reported on 09/17/2020    [provider]  PARoxetine (PAXIL) 10 MG tablet Take 10 mg by mouth daily. Patient not taking: Reported on 09/17/2020    [provider]  predniSONE (STERAPRED UNI-PAK 21 TAB) 10 MG (21) TBPK tablet Take by mouth daily. Take 6 tabs by mouth daily  for 2 days, then 5 tabs for 2 days, then 4 tabs for 2 days, then 3 tabs for 2 days, 2 tabs for 2 days, then 1 tab by mouth daily for 2 days Patient not taking: Reported on 04/18/2022 10/07/21   Gailen Shelter, PA  Vitamin D,  Ergocalciferol, (DRISDOL) 1.25 MG (50000 UNIT) CAPS capsule Take 50,000 Units by mouth once a week. 04/16/22   [provider]  VRAYLAR 1.5 MG capsule Take 1.5 mg by mouth daily. 05/28/21   [provider]    Family History History reviewed. No pertinent family history.  Social History Social History   Tobacco Use   Smoking status: Never    Passive exposure: Past   Smokeless tobacco: Former    Types: Chew    Quit date: 01/2021  Vaping Use   Vaping Use: Some days   Substances: Nicotine, Flavoring  Substance Use Topics   Alcohol use: Yes    Alcohol/week: 3.0 standard drinks of alcohol    Types: 3 Cans of beer per week    Comment: per week   Drug use: No     Allergies   Coconut (cocos nucifera) and Motrin [ibuprofen]   Review of Systems Review of Systems   Physical Exam Triage Vital Signs ED Triage Vitals  Enc Vitals Group     BP 04/18/22 1956 96/63     Pulse Rate 04/18/22 1956 85     Resp 04/18/22 1956 20     Temp 04/18/22 1956 98.2 F (36.8 C)     Temp src --      SpO2 04/18/22 1956 98 %     Weight --      Height --      Head Circumference --      Peak Flow --      Pain Score 04/18/22 1955 0     Pain Loc --      Pain Edu? --      Excl. in GC? --    No data found.  Updated Vital Signs BP 127/84   Pulse 81   Temp 98.2 F (36.8 C)   Resp 20   SpO2 98%   Visual Acuity Right Eye Distance:   Left Eye Distance:   Bilateral Distance:    Right Eye Near:   Left Eye Near:    Bilateral Near:     Physical Exam  Initially patient was alert and oriented.  CV regular rate and rhythm without murmur lungs were clear no edema.  We had ordered a chest x-ray and blood work. When she sat down to have her labs drawn, she fainted.  She continued to have a heart rate and respirations.  We moved her into one of the exam rooms.  Heart rate was 98 blood pressure was 115/78.  It took about 5 minutes and then she came to.  She began crying that she  does not want to go to the emergency room because they never do anything.    UC Treatments / Results  Labs (all labs ordered are listed, but only abnormal results are displayed) Labs Reviewed  POCT FASTING CBG [facility] MANUAL ENTRY - Abnormal; Notable for the following components:      Result Value   POCT Glucose ([facility]) 100 (*)    All other components within normal limits     EKG   Radiology No results found.  Procedures Procedures (including critical care time)  Medications Ordered in UC Medications  ondansetron (ZOFRAN) injection 4 mg (4 mg Intravenous Given 04/18/22 2049)  0.9 %  sodium chloride infusion (0 mLs Intravenous Stopped 04/18/22 2049)    Initial Impression / Assessment and Plan / UC Course  I have reviewed the triage vital signs and the nursing notes.  Pertinent labs & imaging results that were available during my care of the patient were reviewed by me and considered in my medical decision making (see chart for details).     We did of course call 911 for the ambulance.  We spoke with her wife who had been in the car with their child.  Her partner stated that she had been rubbing her sternum to keep her awake when they were driving here to be seen.  The partner had wanted to take her to the emergency room but the patient would not go.    EKG showed NSR with an occ PAC.  Gluc was 100. We administered zofran IV as she had 2 episodes of vomiting.  Approx 600 ml of NS given IV before ambulance arrived.  Partner was agreeable for her to go to ER Final Clinical Impressions(s) / UC Diagnoses   Final diagnoses:  Dizziness and giddiness     Discharge Instructions      Patient was advised to go by ambulance to the emergency room     ED Prescriptions   None    PDMP not reviewed this encounter.   Zenia Resides, MD 04/18/22 2025    Zenia Resides, MD 04/18/22 2027    Zenia Resides, MD 04/18/22 2051

## 2022-04-18 NOTE — ED Notes (Signed)
Patient is being discharged from the Urgent Care and sent to the Emergency Department via ems . Per banister, md, patient is in need of higher level of care due to syncopal episode. Patient is aware and verbalizes understanding of plan of care.  Vitals:   04/18/22 2035 04/18/22 2040  BP: (!) 115/97 127/84  Pulse: (!) 102 81  Resp:  20  Temp:    SpO2: 98% 98%

## 2022-04-18 NOTE — ED Triage Notes (Signed)
Pt presents to uc with co of dizziness and cp, sob. Pt reports she was seen last week for syncope and hypotension. Pt reports she saw her pcp today and is frustrated bc he did not order X-ray or follow up labs. 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

## 2022-04-18 NOTE — ED Notes (Signed)
Pt was with rad tech in lab when pt st " I do not feel well and she began to pass out"  Rad tech called rn and md to assist. Pt was not alert but was breathing and heart rate was palpitated. Pt was transferred to wheelchair and back to room so we could preform vitals

## 2022-04-18 NOTE — Discharge Instructions (Addendum)
Patient was advised to go by ambulance to the emergency room

## 2022-04-22 ENCOUNTER — Other Ambulatory Visit: Payer: Self-pay

## 2022-04-22 ENCOUNTER — Emergency Department (HOSPITAL_COMMUNITY)
Admission: EM | Admit: 2022-04-22 | Discharge: 2022-04-22 | Discharge status: Against Medical Advice | Payer: BLUE CROSS/BLUE SHIELD | Attending: Emergency Medicine | Admitting: Emergency Medicine

## 2022-04-22 ENCOUNTER — Encounter (HOSPITAL_COMMUNITY): Payer: Self-pay | Admitting: Emergency Medicine

## 2022-04-22 ENCOUNTER — Emergency Department (HOSPITAL_COMMUNITY): Payer: BLUE CROSS/BLUE SHIELD

## 2022-04-22 DIAGNOSIS — R55 Syncope and collapse: Secondary | ICD-10-CM | POA: Insufficient documentation

## 2022-04-22 DIAGNOSIS — Z5321 Procedure and treatment not carried out due to patient leaving prior to being seen by health care provider: Secondary | ICD-10-CM | POA: Insufficient documentation

## 2022-04-22 LAB — I-STAT BETA HCG BLOOD, ED (MC, WL, AP ONLY): I-stat hCG, quantitative: <5 m[IU]/mL (ref <5)

## 2022-04-22 LAB — CBC
HCT: 37.1 % (ref 36.0–46.0)
Hemoglobin: 12.1 g/dL (ref 12.0–15.0)
MCH: 30.8 pg (ref 26.0–34.0)
MCHC: 32.6 g/dL (ref 30.0–36.0)
MCV: 94.4 fL (ref 80.0–100.0)
Platelets: 175 10*3/uL (ref 150–400)
RBC: 3.93 MIL/uL (ref 3.87–5.11)
RDW: 13.2 % (ref 11.5–15.5)
WBC: 3.2 10*3/uL — ABNORMAL LOW (ref 4.0–10.5)
nRBC: 0 % (ref 0.0–0.2)

## 2022-04-22 LAB — BASIC METABOLIC PANEL
Anion gap: 10 (ref 5–15)
BUN: 9 mg/dL (ref 6–20)
CO2: 19 mmol/L — ABNORMAL LOW (ref 22–32)
Calcium: 8.7 mg/dL — ABNORMAL LOW (ref 8.9–10.3)
Chloride: 108 mmol/L (ref 98–111)
Creatinine, Ser: 0.9 mg/dL (ref 0.44–1.00)
GFR, Estimated: >60 mL/min (ref >60)
Glucose, Bld: 92 mg/dL (ref 70–99)
Potassium: 3.8 mmol/L (ref 3.5–5.1)
Sodium: 137 mmol/L (ref 135–145)
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
## 2022-04-22 NOTE — ED Provider Notes (Signed)
Patient notified me that she no longer wants to stay, needs to go home, explained to her that this is only triage and she has not been fully evaluated by a provider in the back, and that she needs to come back if symptoms are worsening.   Carroll Sage, PA-C 04/22/22 2345    Dione Booze, MD 04/23/22 [phone]

## 2022-04-22 NOTE — ED Provider Triage Note (Signed)
Emergency Medicine Provider Triage Evaluation Note  Frances Reed , a 31 y.o. female  was evaluated in triage.  Pt complains of syncope, going since May, will feel lightheaded dizziness plus or minus chest pain and then passed out, will come back to, no postictal state no tongue bite biting or urinary continence he, remote history of seizure disorder, has had work-up in the past all of which were negative..  Review of Systems  Positive: Syncope, chest pain Negative:headache, change in vision   Physical Exam  BP 115/74   Pulse 90   Temp 98.2 F (36.8 C) (Oral)   Resp 16   Wt 80 kg   SpO2 99%   BMI 30.27 kg/m  Gen:   Awake, no distress   Resp:  Normal effort  MSK:   Moves extremities without difficulty  Other:    Medical Decision Making  Medically screening exam initiated at 10:46 PM.  Appropriate orders placed.  Taleen Santone was informed that the remainder of the evaluation will be completed by another provider, this initial triage assessment does not replace that evaluation, and the importance of remaining in the ED until their evaluation is complete.  Lab work and imaging have been worked     [employer], PA-C 04/22/22 2250

## 2022-04-23 LAB — TROPONIN I (HIGH SENSITIVITY): Troponin I (High Sensitivity): <2 ng/L (ref <18)

## 2022-08-31 ENCOUNTER — Telehealth: Payer: Self-pay

## 2022-08-31 NOTE — Telephone Encounter (Signed)
Mychart msg sent. AS, CMA 

## 2022-10-21 ENCOUNTER — Emergency Department (HOSPITAL_COMMUNITY)
Admission: EM | Admit: 2022-10-21 | Discharge: 2022-10-21 | Disposition: A | Discharge status: Home / Self Care | Payer: BLUE CROSS/BLUE SHIELD | Attending: Emergency Medicine | Admitting: Emergency Medicine

## 2022-10-21 ENCOUNTER — Emergency Department (HOSPITAL_COMMUNITY): Payer: BLUE CROSS/BLUE SHIELD

## 2022-10-21 ENCOUNTER — Encounter (HOSPITAL_COMMUNITY): Payer: Self-pay

## 2022-10-21 DIAGNOSIS — Y92410 Unspecified street and highway as the place of occurrence of the external cause: Secondary | ICD-10-CM | POA: Insufficient documentation

## 2022-10-21 DIAGNOSIS — M542 Cervicalgia: Secondary | ICD-10-CM | POA: Insufficient documentation

## 2022-10-21 MED ORDER — METHOCARBAMOL 500 MG PO TABS
500.0000 mg | ORAL_TABLET | Freq: Once | ORAL | Status: AC
  Administered 2022-10-21: 500 mg via ORAL
  Filled 2022-10-21: qty 1

## 2022-10-21 MED ORDER — METHOCARBAMOL 500 MG PO TABS: 500.0000 mg | ORAL_TABLET | Freq: Two times a day (BID) | ORAL | 0 refills | Status: DC
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
## 2022-10-21 NOTE — ED Notes (Signed)
Pt A&OX4 ambulatory at d/c with independent steady gait, NAD. Pt verbalized understanding of d/c instructions, prescription and follow up care.
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
## 2022-11-18 ENCOUNTER — Ambulatory Visit
Admission: EM | Admit: 2022-11-18 | Discharge: 2022-11-18 | Discharge status: Against Medical Advice | Payer: BLUE CROSS/BLUE SHIELD

## 2023-02-09 ENCOUNTER — Ambulatory Visit
Admission: EM | Admit: 2023-02-09 | Discharge: 2023-02-09 | Disposition: A | Discharge status: Home / Self Care | Payer: BLUE CROSS/BLUE SHIELD

## 2023-02-09 DIAGNOSIS — R55 Syncope and collapse: Secondary | ICD-10-CM

## 2023-02-09 DIAGNOSIS — R21 Rash and other nonspecific skin eruption: Secondary | ICD-10-CM | POA: Diagnosis not present

## 2023-02-09 DIAGNOSIS — R0602 Shortness of breath: Secondary | ICD-10-CM

## 2023-02-09 DIAGNOSIS — R112 Nausea with vomiting, unspecified: Secondary | ICD-10-CM | POA: Diagnosis not present

## 2023-02-09 DIAGNOSIS — R410 Disorientation, unspecified: Secondary | ICD-10-CM

## 2023-02-09 NOTE — ED Notes (Signed)
Patient is being discharged from the Urgent Care and sent to the Emergency Department via POV. Per Laren Everts FNP, patient is in need of higher level of care due to need for further evaluation . Patient is aware and verbalizes understanding of plan of care.  Vitals:   02/09/23 1257  BP: 129/87  Pulse: 71  Resp: 18  Temp: 97.8 F (36.6 C)  SpO2: 98%

## 2023-02-09 NOTE — ED Provider Notes (Signed)
[name]    CSN: [id number] Arrival date & time: 02/09/23  1232      History   Chief Complaint Chief Complaint  Patient presents with   Leg Pain   Shortness of Breath   Nausea    HPI Frances Reed is a 32 y.o. female.   Patient presents with concern of rash to left lateral leg that she noticed 5 days ago.  Patient states that she started having some left knee pain prior to the rash starting.  She states that rash was present this morning but is now disappeared.  Rash was painful and itchy.  Denies any change in the environment that could have caused rash.  Patient reports that she also had a syncopal episode the day after rash appeared.  She denies hitting head or losing consciousness.  Reports that her wife witnessed the fall.  She states that she felt fine prior to syncopal episode and was cleaning her house at the time.  States that she was very disoriented after the syncopal episode and kept asking her wife the same question.  She states that she has "lost sense of time" over the past few days as well.  She states that after work she started feeling some intermittent shortness of breath and nausea without vomiting earlier this morning as well.  Denies any fever, body aches, chills.  Denies any recent insect or tick bites.  Patient has a history of Klippel-Trenaunay-Weber syndrome.    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    Past  Surgical History:  Procedure Laterality Date   FOOT SURGERY Bilateral    LAPAROSCOPY N/A 05/21/2019   Procedure: LAPAROSCOPY DIAGNOSTIC, Left Pelvic Cystectomy ;  Surgeon: Hermina Staggers, MD;  Location: [facility] OR;  Service: Gynecology;  Laterality: N/A;   SKIN GRAFT Right 2000   abdominal graft for foot   THROAT SURGERY     TONSILLECTOMY      OB History     Gravida  1   Para  1   Term  1   Preterm      AB      Living  1      SAB      IAB      Ectopic      Multiple      Live Births  1            Home Medications    Prior to Admission medications   Medication Sig Start Date End Date Taking? Authorizing Provider  albuterol (VENTOLIN HFA) 108 (90 Base) MCG/ACT inhaler 1 puff every 4 (four) hours. 03/08/21   [provider]  clonazePAM (KLONOPIN) 0.5 MG tablet 1 tablet 03/08/21   [provider]  diclofenac (VOLTAREN) 75 MG EC tablet Take 75 mg by mouth 2 (two) times daily. 04/03/22   [provider]  diclofenac Sodium (VOLTAREN) 1 % GEL Apply 4 g topically 4 (four) times daily. 10/07/21  Fondaw, Wylder S, PA  gabapentin (NEURONTIN) 100 MG capsule TAKE 1 CAPSULE BY MOUTH TWICE A DAY 06/24/17   Ellyn Hack, MD  gabapentin (NEURONTIN) 100 MG capsule Take 100 mg by mouth 2 (two) times daily.    [provider]  gabapentin (NEURONTIN) 100 MG capsule Take 2 capsules by mouth daily.    [provider]  lamoTRIgine (LAMICTAL) 200 MG tablet 2 tablets 11/06/21   [provider]  levothyroxine (SYNTHROID) 50 MCG tablet 1 tablet in the morning on an empty stomach Orally Once a day for 90 day(s) 10/18/19   [provider]  Levothyroxine Sodium (SYNTHROID PO) Take by mouth.    [provider]  lidocaine (LIDODERM) 5 % Place 1 patch onto the skin daily. Remove & Discard patch within 12 hours or as directed by MD 10/07/21   Gailen Shelter, PA  methocarbamol (ROBAXIN) 500 MG tablet Take 1 tablet (500 mg total)  by mouth 2 (two) times daily. 10/21/22   Roemhildt, Lorin T, PA-C  PARoxetine (PAXIL) 10 MG tablet Take 10 mg by mouth daily. Patient not taking: Reported on 09/17/2020    [provider]  PARoxetine (PAXIL) 10 MG tablet Take 10 mg by mouth daily. Patient not taking: Reported on 09/17/2020    [provider]  predniSONE (STERAPRED UNI-PAK 21 TAB) 10 MG (21) TBPK tablet Take by mouth daily. Take 6 tabs by mouth daily  for 2 days, then 5 tabs for 2 days, then 4 tabs for 2 days, then 3 tabs for 2 days, 2 tabs for 2 days, then 1 tab by mouth daily for 2 days Patient not taking: Reported on 04/18/2022 10/07/21   Gailen Shelter, PA  Vitamin D, Ergocalciferol, (DRISDOL) 1.25 MG (50000 UNIT) CAPS capsule Take 50,000 Units by mouth once a week. 04/16/22   [provider]  VRAYLAR 1.5 MG capsule Take 1.5 mg by mouth daily. 05/28/21   [provider]    Family History History reviewed. No pertinent family history.  Social History Social History   Tobacco Use   Smoking status: Never    Passive exposure: Past   Smokeless tobacco: Former    Types: Chew    Quit date: 01/2021  Vaping Use   Vaping Use: Some days   Substances: Nicotine, Flavoring  Substance Use Topics   Alcohol use: Yes    Alcohol/week: 3.0 standard drinks of alcohol    Types: 3 Cans of beer per week    Comment: per week   Drug use: No     Allergies   Coconut (cocos nucifera)   Review of Systems Review of Systems Per HPI  Physical Exam Triage Vital Signs ED Triage Vitals  Enc Vitals Group     BP 02/09/23 1257 129/87     Pulse Rate 02/09/23 1257 71     Resp 02/09/23 1257 18     Temp 02/09/23 1257 97.8 F (36.6 C)     Temp Source 02/09/23 1257 Oral     SpO2 02/09/23 1257 98 %     Weight --      Height --      Head Circumference --      Peak Flow --      Pain Score 02/09/23 1255 4     Pain Loc --      Pain Edu? --      Excl. in GC? --    No data found.  Updated Vital  Signs BP  129/87 (BP Location: Right Arm)   Pulse 71   Temp 97.8 F (36.6 C) (Oral)   Resp 18   LMP 02/08/2023   SpO2 98%   Visual Acuity Right Eye Distance:   Left Eye Distance:   Bilateral Distance:    Right Eye Near:   Left Eye Near:    Bilateral Near:     Physical Exam Constitutional:      General: She is not in acute distress.    Appearance: Normal appearance. She is not toxic-appearing or diaphoretic.  HENT:     Head: Normocephalic and atraumatic.  Eyes:     Extraocular Movements: Extraocular movements intact.     Conjunctiva/sclera: Conjunctivae normal.     Pupils: Pupils are equal, round, and reactive to light.  Cardiovascular:     Rate and Rhythm: Normal rate and regular rhythm.     Pulses: Normal pulses.     Heart sounds: Normal heart sounds.  Pulmonary:     Effort: Pulmonary effort is normal. No respiratory distress.     Breath sounds: Normal breath sounds. No stridor. No wheezing, rhonchi or rales.  Skin:    Comments: No obvious rash noted to area of leg.   Neurological:     General: No focal deficit present.     Mental Status: She is alert and oriented to person, place, and time. Mental status is at baseline.     Cranial Nerves: Cranial nerves 2-12 are intact.     Sensory: Sensation is intact.     Motor: Motor function is intact.     Coordination: Coordination is intact.     Gait: Gait is intact.  Psychiatric:        Mood and Affect: Mood normal.        Behavior: Behavior normal.        Thought Content: Thought content normal.        Judgment: Judgment normal.      UC Treatments / Results  Labs (all labs ordered are listed, but only abnormal results are displayed) Labs Reviewed - No data to display  EKG   Radiology No results found.  Procedures Procedures (including critical care time)  Medications Ordered in UC Medications - No data to display  Initial Impression / Assessment and Plan / UC Course  I have reviewed the triage vital  signs and the nursing notes.  Pertinent labs & imaging results that were available during my care of the patient were reviewed by me and considered in my medical decision making (see chart for details).     I am not sure the exact etiology of patient's symptoms but given that patient has had a syncopal episode and has been feeling disoriented ever since, recommended that she go to the ER today to have a full and adequate workup given limited resources here in urgent care.  I am not sure if rash, nausea, vomiting, shortness of breath are related but it is concerning that it could be.  Patient was agreeable to going to the ER.  Neuroexam is normal so agree with patient's family member transporting her to the ER. Final Clinical Impressions(s) / UC Diagnoses   Final diagnoses:  Rash and nonspecific skin eruption  Syncope, unspecified syncope type  Shortness of breath  Nausea and vomiting, unspecified vomiting type  Disorientation   Discharge Instructions   None    ED Prescriptions   None    PDMP not reviewed this encounter.   Gustavus Bryant, [place] 02/09/23  1320  

## 2023-02-09 NOTE — ED Triage Notes (Addendum)
Pt c/o inflammation to left upper leg (lateral)- started last Wednesday, nausea, and episodes of SHOB. The patient denies chest pain.

## 2023-10-02 ENCOUNTER — Ambulatory Visit (HOSPITAL_COMMUNITY)
Admission: EM | Admit: 2023-10-02 | Discharge: 2023-10-02 | Disposition: A | Discharge status: Home / Self Care | Payer: BLUE CROSS/BLUE SHIELD | Attending: Urgent Care | Admitting: Urgent Care

## 2023-10-02 ENCOUNTER — Encounter (HOSPITAL_COMMUNITY): Payer: Self-pay

## 2023-10-02 DIAGNOSIS — Q872 Congenital malformation syndromes predominantly involving limbs: Secondary | ICD-10-CM | POA: Diagnosis not present

## 2023-10-02 DIAGNOSIS — M25561 Pain in right knee: Secondary | ICD-10-CM | POA: Diagnosis not present

## 2023-10-02 MED ORDER — DICLOFENAC SODIUM 75 MG PO TBEC: 75.0000 mg | DELAYED_RELEASE_TABLET | Freq: Two times a day (BID) | ORAL | 0 refills | Status: DC

## 2023-10-02 NOTE — Discharge Instructions (Signed)
 Please obtain an outpatient ultrasound image to rule out a DVT. You will report to [facility] around 10:45am tomorrow 10/03/23 If at any point in time between now and then you develop new or worsening symptoms, please head to the ER.

## 2023-10-02 NOTE — ED Triage Notes (Signed)
 Pt presents with c/o rt knee pain X 1 wk. Pt states she woke up today feeling the worst pain that radiates to the rt hip. Pt denies injury.

## 2023-10-02 NOTE — ED Provider Notes (Signed)
 [facility]    CSN: [id number] Arrival date & time: 10/02/23  1902      History   Chief Complaint Chief Complaint  Patient presents with   Knee Pain    HPI Frances Reed is a 33 y.o. female.   Pleasant 33 year old female presents today due to concerns of knee pain.  She states for the past week she has been having significant pain, with any movement, particularly in the popliteal region.  She states over the past day or 2 it is also migrated up the thigh and into her right hip.  She denies any trauma or injury.  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  This is not a new job however and has never had this before.  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  She denies pain in the calf.  She is concerned however that her right leg looks bigger than normal.  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  But now she feels like the right side is the same size as the left, which is new for her.  Earlier today, she did have a new rash to her thigh and knee area, which resolved without treatment.  She does have a history of using diclofenac  for an unrelated condition, and is requesting a refill to help with the pain.  She states that movement is causing significant discomfort.  She denies any fever, tachycardia, chest pain or shortness of breath.   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   Pain of right lower leg 10/18/2015   Generalized anxiety disorder 06/13/2015   Carpal tunnel syndrome 01/18/2015   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    Past Surgical History:  Procedure Laterality Date   FOOT SURGERY Bilateral    LAPAROSCOPY N/A 05/21/2019   Procedure: LAPAROSCOPY DIAGNOSTIC, Left Pelvic Cystectomy ;  Surgeon: Lorence Ozell CROME, MD;  Location: [facility];  Service: Gynecology;  Laterality: N/A;   SKIN GRAFT Right 2000   abdominal graft for foot   THROAT SURGERY     TONSILLECTOMY      OB History     Gravida  1   Para  1   Term  1   Preterm      AB      Living  1      SAB      IAB      Ectopic      Multiple      Live Births  1            Home Medications    Prior to Admission medications   Medication Sig Start Date End Date Taking? Authorizing Provider  albuterol (VENTOLIN HFA) 108 (90 Base) MCG/ACT inhaler 1 puff every 4 (four) hours. 03/08/21   [provider]  clonazePAM  (KLONOPIN ) 0.5 MG tablet 1  tablet 03/08/21   [provider]  diclofenac  (VOLTAREN ) 75 MG EC tablet Take 1 tablet (75 mg total) by mouth 2 (two) times daily with a meal. 10/02/23   Isis Costanza L, PA  diclofenac  Sodium (VOLTAREN ) 1 % GEL Apply 4 g topically 4 (four) times daily. 10/07/21   Neldon Hamp RAMAN, PA  gabapentin  (NEURONTIN ) 100 MG capsule TAKE 1 CAPSULE BY MOUTH TWICE A DAY 06/24/17   Maree Leni Edyth DELENA, MD  gabapentin  (NEURONTIN ) 100 MG capsule Take 100 mg by mouth 2 (two) times daily.    [provider]  gabapentin  (NEURONTIN ) 100 MG capsule Take 2 capsules by mouth daily.    [provider]  lamoTRIgine (LAMICTAL) 200 MG tablet 2 tablets 11/06/21   [provider]  levothyroxine  (SYNTHROID ) 50 MCG tablet 1 tablet in the morning on an empty stomach Orally Once a day for 90 day(s) 10/18/19   [provider]  Levothyroxine  Sodium (SYNTHROID  PO) Take by mouth.    [provider]   lidocaine  (LIDODERM ) 5 % Place 1 patch onto the skin daily. Remove & Discard patch within 12 hours or as directed by MD 10/07/21   Neldon Hamp RAMAN, PA  methocarbamol  (ROBAXIN ) 500 MG tablet Take 1 tablet (500 mg total) by mouth 2 (two) times daily. 10/21/22   Roemhildt, Lorin T, PA-C  PARoxetine  (PAXIL ) 10 MG tablet Take 10 mg by mouth daily. Patient not taking: Reported on 09/17/2020    [provider]  PARoxetine  (PAXIL ) 10 MG tablet Take 10 mg by mouth daily. Patient not taking: Reported on 09/17/2020    [provider]  predniSONE  (STERAPRED UNI-PAK 21 TAB) 10 MG (21) TBPK tablet Take by mouth daily. Take 6 tabs by mouth daily  for 2 days, then 5 tabs for 2 days, then 4 tabs for 2 days, then 3 tabs for 2 days, 2 tabs for 2 days, then 1 tab by mouth daily for 2 days Patient not taking: Reported on 04/18/2022 10/07/21   Neldon Hamp RAMAN, PA  Vitamin D, Ergocalciferol, (DRISDOL) 1.25 MG (50000 UNIT) CAPS capsule Take 50,000 Units by mouth once a week. 04/16/22   [provider]  VRAYLAR 1.5 MG capsule Take 1.5 mg by mouth daily. 05/28/21   [provider]    Family History History reviewed. No pertinent family history.  Social History Social History   Tobacco Use   Smoking status: Never    Passive exposure: Past   Smokeless tobacco: Former    Types: Chew    Quit date: 01/2021  Vaping Use   Vaping status: Some Days   Substances: Nicotine, Flavoring  Substance Use Topics   Alcohol use: Yes    Alcohol/week: 3.0 standard drinks of alcohol    Types: 3 Cans of beer per week    Comment: per week   Drug use: No     Allergies   Coconut (cocos nucifera)   Review of Systems Review of Systems As per HPI  Physical Exam Triage Vital Signs ED Triage Vitals  Encounter Vitals Group     BP 10/02/23 2011 126/84     Systolic BP Percentile --      Diastolic BP Percentile --      Pulse Rate 10/02/23 2011 87     Resp 10/02/23 2011 20     Temp 10/02/23  2011 98.2 F (36.8 C)     Temp Source 10/02/23 2011 Oral     SpO2 10/02/23 2011 98 %  Weight --      Height --      Head Circumference --      Peak Flow --      Pain Score 10/02/23 2018 6     Pain Loc --      Pain Education --      Exclude from Growth Chart --    No data found.  Updated Vital Signs BP 126/84 (BP Location: Right Arm)   Pulse 87   Temp 98.2 F (36.8 C) (Oral)   Resp 20   LMP 09/25/2023 (Exact Date)   SpO2 98%   Visual Acuity Right Eye Distance:   Left Eye Distance:   Bilateral Distance:    Right Eye Near:   Left Eye Near:    Bilateral Near:     Physical Exam Vitals and nursing note reviewed.  Constitutional:      General: She is not in acute distress.    Appearance: Normal appearance. She is not ill-appearing or toxic-appearing.  HENT:     Head: Normocephalic.  Cardiovascular:     Rate and Rhythm: Normal rate.  Pulmonary:     Effort: Pulmonary effort is normal. No respiratory distress.  Musculoskeletal:        General: Tenderness present. No swelling. Normal range of motion.     Right knee: No crepitus. Normal range of motion. Tenderness present. Normal alignment, normal meniscus and normal patellar mobility. Normal pulse.     Right lower leg: No tenderness or bony tenderness.     Right ankle: Normal. No swelling or deformity.     Left ankle: Normal.     Comments: Negative homan sign. Both calves measure 17cm Significant dilated varicosities noted to R lower extremity  Skin:    General: Skin is warm and dry.     Findings: No rash.     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     Mental Status: She is alert and oriented to person, place, and time.     Gait: Gait normal.      UC Treatments / Results  Labs (all labs ordered are listed, but only abnormal results are displayed) Labs Reviewed - No data to display  EKG   Radiology No results  found.  Procedures Procedures (including critical care time)  Medications Ordered in UC Medications - No data to display  Initial Impression / Assessment and Plan / UC Course  I have reviewed the triage vital signs and the nursing notes.  Pertinent labs & imaging results that were available during my care of the patient were reviewed by me and considered in my medical decision making (see chart for details).     Acute pain of R knee - this was not caused by trauma; overall benign appearing exam which raises concern for this possibly being related to DVT. Pt without sx concerning for PE. Will obtain STAT venous doppler US  of RLE. PRN diclofenac  to help with pain. ER precautions discussed with patient.   Final Clinical Impressions(s) / UC Diagnoses   Final diagnoses:  Acute pain of right knee  Klippel Trenaunay syndrome     Discharge Instructions      Please obtain an outpatient ultrasound image to rule out a DVT. You will report to [facility] around 10:45am tomorrow 10/03/23 If at any point in time between now and then you develop new or worsening symptoms, please head  to the ER.   ED Prescriptions     Medication Sig Dispense Auth. Provider   diclofenac  (VOLTAREN ) 75 MG EC tablet Take 1 tablet (75 mg total) by mouth 2 (two) times daily with a meal. 14 tablet Crispin Vogel L, PA      PDMP not reviewed this encounter.   Lowella Benton CROME, [place] 10/02/23 2201

## 2023-10-03 ENCOUNTER — Other Ambulatory Visit: Payer: Self-pay | Admitting: Urgent Care

## 2023-10-03 ENCOUNTER — Ambulatory Visit: Payer: Self-pay | Admitting: Family Medicine

## 2023-10-03 ENCOUNTER — Ambulatory Visit (HOSPITAL_COMMUNITY)
Admission: RE | Admit: 2023-10-03 | Discharge: 2023-10-03 | Disposition: A | Discharge status: Home / Self Care | Payer: BLUE CROSS/BLUE SHIELD | Source: Ambulatory Visit | Attending: Urgent Care | Admitting: Urgent Care

## 2023-10-03 DIAGNOSIS — M79604 Pain in right leg: Secondary | ICD-10-CM | POA: Insufficient documentation

## 2023-10-03 DIAGNOSIS — M7989 Other specified soft tissue disorders: Secondary | ICD-10-CM | POA: Diagnosis not present

## 2023-10-03 DIAGNOSIS — M79605 Pain in left leg: Secondary | ICD-10-CM | POA: Insufficient documentation

## 2023-10-03 MED ORDER — CARISOPRODOL 350 MG PO TABS: 350.0000 mg | ORAL_TABLET | Freq: Three times a day (TID) | ORAL | 0 refills | Status: DC | PRN

## 2023-10-03 NOTE — Progress Notes (Signed)
 Lower extremity venous duplex completed. Please see CV Procedures for preliminary results.  Estanislao Heimlich, RVT 10/03/23 11:15 AM

## 2023-10-03 NOTE — Telephone Encounter (Signed)
 Copied from CRM [id number]. Topic: Clinical - Lab/Test Results >> Oct 03, 2023 12:09 PM Frances Reed wrote: Reason for CRM: Patient called to request that Dr. Lowella, whom she saw in the ER yesterday, review the venous study that she ordered.   Order STAT. Study available at 11:57. Patient advised that she's in pain. Pain in right knee, going up to hip on right side. Patient advised that she can't sit comfortably or go about her day as she usually would. Please assist.   Chief Complaint: Knee pain Symptoms: Right knee pain Frequency: Constant  Pertinent Negatives: Patient denies any redness to knee, fever, chest pain, or difficulty breathing  Disposition: [] ED /[] Urgent Care (no appt availability in office) / [x] Appointment(In office/virtual)/ []  [facility]/ [] Home Care/ [] Refused Recommended Disposition /[] [facility]/ []  Follow-up with PCP Additional Notes: Patient reports she has been experiencing right knee pain for approximately 1 week. She states that she was seen yesterday at urgent care for the same and had an ultrasound today to rule out blood clots. She states that she was prescribed medication but has not been able to pick it up yet. Patient requesting a follow up appointment and to establish care with a new PCP, and also inquired about something for her pain. Appointment made for the patient on Tuesday with preferred provider. I have advised the patient that the prescription she was written is for pain and to pick it up and take it as instructed. Patient verbalized understanding and agreement with this plan.     Reason for Disposition  [1] MODERATE pain (e.g., interferes with normal activities, limping) AND [2] present > 3 days  Answer Assessment - Initial Assessment Questions 1. LOCATION and RADIATION: Where is the pain located?      Right knee pain  2. QUALITY: What does the pain feel like?  (e.g., sharp, dull, aching, burning)     Sharp to front and behind  right knee 3. SEVERITY: How bad is the pain? What does it keep you from doing?   (Scale 1-10; or mild, moderate, severe)   -  MILD (1-3): doesn't interfere with normal activities    -  MODERATE (4-7): interferes with normal activities (e.g., work or school) or awakens from sleep, limping    -  SEVERE (8-10): excruciating pain, unable to do any normal activities, unable to walk     8/10 4. ONSET: When did the pain start? Does it come and go, or is it there all the time?     1 week ago  5. RECURRENT: Have you had this pain before? If Yes, ask: When, and what happened then?     No 6. SETTING: Has there been any recent work, exercise or other activity that involved that part of the body?      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 AGGRAVATING FACTORS: What makes the knee pain worse? (e.g., walking, climbing stairs, running)     Walking 8. ASSOCIATED SYMPTOMS: Is there any swelling or redness of the knee?     No 9. OTHER SYMPTOMS: Do you have any other symptoms? (e.g., chest pain, difficulty breathing, fever, calf pain)     No  10. PREGNANCY: Is there any chance you are pregnant? When was your last menstrual period?       No  Protocols used: Knee Pain-A-AH

## 2023-10-07 ENCOUNTER — Encounter: Payer: Self-pay | Admitting: Urgent Care

## 2023-10-07 ENCOUNTER — Ambulatory Visit (HOSPITAL_COMMUNITY)
Admission: RE | Admit: 2023-10-07 | Discharge: 2023-10-07 | Disposition: A | Discharge status: Home / Self Care | Payer: BLUE CROSS/BLUE SHIELD | Source: Ambulatory Visit | Attending: Urgent Care | Admitting: Urgent Care

## 2023-10-07 ENCOUNTER — Ambulatory Visit (INDEPENDENT_AMBULATORY_CARE_PROVIDER_SITE_OTHER): Payer: 59 | Admitting: Urgent Care

## 2023-10-07 ENCOUNTER — Other Ambulatory Visit: Payer: Self-pay | Admitting: Urgent Care

## 2023-10-07 VITALS — BP 132/87 | HR 80 | Ht 66.0 in | Wt 215.1 lb

## 2023-10-07 DIAGNOSIS — Z131 Encounter for screening for diabetes mellitus: Secondary | ICD-10-CM | POA: Diagnosis not present

## 2023-10-07 DIAGNOSIS — G894 Chronic pain syndrome: Secondary | ICD-10-CM | POA: Diagnosis present

## 2023-10-07 DIAGNOSIS — E039 Hypothyroidism, unspecified: Secondary | ICD-10-CM | POA: Diagnosis not present

## 2023-10-07 DIAGNOSIS — Z862 Personal history of diseases of the blood and blood-forming organs and certain disorders involving the immune mechanism: Secondary | ICD-10-CM

## 2023-10-07 DIAGNOSIS — M25561 Pain in right knee: Secondary | ICD-10-CM | POA: Insufficient documentation

## 2023-10-07 DIAGNOSIS — M7651 Patellar tendinitis, right knee: Secondary | ICD-10-CM

## 2023-10-07 DIAGNOSIS — F411 Generalized anxiety disorder: Secondary | ICD-10-CM

## 2023-10-07 DIAGNOSIS — F319 Bipolar disorder, unspecified: Secondary | ICD-10-CM | POA: Diagnosis not present

## 2023-10-07 DIAGNOSIS — Q872 Congenital malformation syndromes predominantly involving limbs: Secondary | ICD-10-CM

## 2023-10-07 DIAGNOSIS — F429 Obsessive-compulsive disorder, unspecified: Secondary | ICD-10-CM | POA: Insufficient documentation

## 2023-10-07 DIAGNOSIS — F428 Other obsessive-compulsive disorder: Secondary | ICD-10-CM

## 2023-10-07 DIAGNOSIS — G4709 Other insomnia: Secondary | ICD-10-CM

## 2023-10-07 DIAGNOSIS — G4733 Obstructive sleep apnea (adult) (pediatric): Secondary | ICD-10-CM

## 2023-10-07 DIAGNOSIS — F410 Panic disorder [episodic paroxysmal anxiety] without agoraphobia: Secondary | ICD-10-CM

## 2023-10-07 LAB — CBC WITH DIFFERENTIAL/PLATELET
Basophils Absolute: 0 10*3/uL (ref 0.0–0.1)
Basophils Relative: 0.8 % (ref 0.0–3.0)
Eosinophils Absolute: 0.1 10*3/uL (ref 0.0–0.7)
Eosinophils Relative: 2.4 % (ref 0.0–5.0)
HCT: 39.1 % (ref 36.0–46.0)
Hemoglobin: 13.1 g/dL (ref 12.0–15.0)
Lymphocytes Relative: 14.1 % (ref 12.0–46.0)
Lymphs Abs: 0.7 10*3/uL (ref 0.7–4.0)
MCHC: 33.5 g/dL (ref 30.0–36.0)
MCV: 95.4 fL (ref 78.0–100.0)
Monocytes Absolute: 0.4 10*3/uL (ref 0.1–1.0)
Monocytes Relative: 7.7 % (ref 3.0–12.0)
Neutro Abs: 3.8 10*3/uL (ref 1.4–7.7)
Neutrophils Relative %: 75 % (ref 43.0–77.0)
Platelets: 201 10*3/uL (ref 150.0–400.0)
RBC: 4.1 Mil/uL (ref 3.87–5.11)
RDW: 15.6 % — ABNORMAL HIGH (ref 11.5–15.5)
WBC: 5 10*3/uL (ref 4.0–10.5)

## 2023-10-07 LAB — COMPREHENSIVE METABOLIC PANEL
ALT: 7 U/L (ref 0–35)
AST: 11 U/L (ref 0–37)
Albumin: 4.2 g/dL (ref 3.5–5.2)
Alkaline Phosphatase: 63 U/L (ref 39–117)
BUN: 14 mg/dL (ref 6–23)
CO2: 28 meq/L (ref 19–32)
Calcium: 8.5 mg/dL (ref 8.4–10.5)
Chloride: 103 meq/L (ref 96–112)
Creatinine, Ser: 0.94 mg/dL (ref 0.40–1.20)
GFR: 80.51 mL/min (ref >60.00)
Glucose, Bld: 84 mg/dL (ref 70–99)
Potassium: 4.3 meq/L (ref 3.5–5.1)
Sodium: 134 meq/L — ABNORMAL LOW (ref 135–145)
Total Bilirubin: 0.7 mg/dL (ref 0.2–1.2)
Total Protein: 6.6 g/dL (ref 6.0–8.3)

## 2023-10-07 LAB — T4, FREE: Free T4: 0.66 ng/dL (ref 0.60–1.60)

## 2023-10-07 LAB — TSH: TSH: 9.03 u[IU]/mL — ABNORMAL HIGH (ref 0.35–5.50)

## 2023-10-07 MED ORDER — GABAPENTIN 100 MG PO CAPS: 100.0000 mg | ORAL_CAPSULE | Freq: Two times a day (BID) | ORAL | 2 refills | Status: DC

## 2023-10-07 MED ORDER — LEVOTHYROXINE SODIUM 50 MCG PO TABS: 50.0000 ug | ORAL_TABLET | Freq: Every day | ORAL | 0 refills | Status: DC

## 2023-10-07 MED ORDER — FLUOXETINE HCL 20 MG PO CAPS: 20.0000 mg | ORAL_CAPSULE | Freq: Every day | ORAL | 3 refills | Status: DC

## 2023-10-07 NOTE — Patient Instructions (Addendum)
Please start taking the gabapentin twice daily. This will help with pain and possibly sleep. Please also start taking fluoxetine once daily - take at night as it might make you sleepy.   Use atarax as needed for panic but use with caution as it may make you sleepy.  Please go to [facility] outpatient imaging for your knee xray. Continue diclofenac and soma as ordered.  Follow up in 3 weeks

## 2023-10-07 NOTE — Progress Notes (Signed)
New Patient Office Visit  Subjective:  Patient ID: Vincent Peyer, female    DOB: [birth year]  Age: 33 y.o. MRN: [id number]  CC:  Chief Complaint  Patient presents with   Establish Care    New pt est care and follow up on knee pain. Pt does not have a GYN.    HPI Karalyne Nusser presents to establish care  Discussed the use of AI scribe software for clinical note transcription with the patient, who gave verbal consent to proceed.  History of Present Illness   Breeze Berringer is a 33 year old female who presents with right knee pain.  She has been experiencing right knee pain for approximately a week, localized to the right side of the knee, which causes difficulty with bending, straightening, and walking. Initially perceived as soreness, the pain intensified when she missed doses of her medications. She uses diclofenac and carisoprodol, which effectively manage the pain when taken consistently, reducing it to zero on a pain scale. However, the pain can flare up with certain movements, such as sudden turns at work. A venous Doppler study ruled out a blood clot. She denies significant gastrointestinal discomfort from diclofenac, except for one instance coinciding with eating [place] food, which she attributes to the food rather than the medication.  She has a history of chronic pain that has worsened over the past year, with pain occurring in random locations. She was previously on gabapentin for pain management and wants to resume it, as it was effective in the past.  She has a history of bipolar disorder, likely bipolar I, and has been off her medications, including Lamictal and Paxil, for over a year. She experiences episodes of depression and mania, with recent months leaning towards depression. She also reports seeing shadows at work, which she attributes to paranormal activity rather than psychosis. Pt's prior med list shows Vraylar, Paxil, Clonazepam, and lamictal.  She experiences  significant anxiety, particularly related to past trauma and current stressors, such as finances. She describes panic attacks occurring multiple times a week and has a history of using benzodiazepine medication for acute anxiety episodes. She reports sx consistent with OCD and is concerned as she has to have things in a very specific order.  She has a history of hypothyroidism and has been off levothyroxine for about a year due to insurance issues, during which she gained approximately thirty pounds.  She reports difficulty with sleep, including trouble falling asleep and staying asleep, despite a quiet and dark environment. She has a history of sleep apnea diagnosed in infancy but does not currently have a CPAP machine.       Outpatient Encounter Medications as of 10/07/2023  Medication Sig   carisoprodol (SOMA) 350 MG tablet Take 1 tablet (350 mg total) by mouth 3 (three) times daily as needed for muscle spasms.   diclofenac (VOLTAREN) 75 MG EC tablet Take 1 tablet (75 mg total) by mouth 2 (two) times daily with a meal.   FLUoxetine (PROZAC) 20 MG capsule Take 1 capsule (20 mg total) by mouth daily.   albuterol (VENTOLIN HFA) 108 (90 Base) MCG/ACT inhaler 1 puff every 4 (four) hours.   clonazePAM (KLONOPIN) 0.5 MG tablet 1 tablet   diclofenac Sodium (VOLTAREN) 1 % GEL Apply 4 g topically 4 (four) times daily.   gabapentin (NEURONTIN) 100 MG capsule Take 1 capsule (100 mg total) by mouth 2 (two) times daily.   lamoTRIgine (LAMICTAL) 200 MG tablet 2 tablets   [DISCONTINUED] gabapentin (NEURONTIN)  100 MG capsule TAKE 1 CAPSULE BY MOUTH TWICE A DAY   [DISCONTINUED] gabapentin (NEURONTIN) 100 MG capsule Take 100 mg by mouth 2 (two) times daily.   [DISCONTINUED] gabapentin (NEURONTIN) 100 MG capsule Take 2 capsules by mouth daily.   [DISCONTINUED] levothyroxine (SYNTHROID) 50 MCG tablet 1 tablet in the morning on an empty stomach Orally Once a day for 90 day(s)   [DISCONTINUED] Levothyroxine Sodium  (SYNTHROID PO) Take by mouth.   [DISCONTINUED] lidocaine (LIDODERM) 5 % Place 1 patch onto the skin daily. Remove & Discard patch within 12 hours or as directed by MD   [DISCONTINUED] methocarbamol (ROBAXIN) 500 MG tablet Take 1 tablet (500 mg total) by mouth 2 (two) times daily.   [DISCONTINUED] PARoxetine (PAXIL) 10 MG tablet Take 10 mg by mouth daily. (Patient not taking: Reported on 09/17/2020)   [DISCONTINUED] PARoxetine (PAXIL) 10 MG tablet Take 10 mg by mouth daily. (Patient not taking: Reported on 09/17/2020)   [DISCONTINUED] predniSONE (STERAPRED UNI-PAK 21 TAB) 10 MG (21) TBPK tablet Take by mouth daily. Take 6 tabs by mouth daily  for 2 days, then 5 tabs for 2 days, then 4 tabs for 2 days, then 3 tabs for 2 days, 2 tabs for 2 days, then 1 tab by mouth daily for 2 days (Patient not taking: Reported on 04/18/2022)   [DISCONTINUED] Vitamin D, Ergocalciferol, (DRISDOL) 1.25 MG (50000 UNIT) CAPS capsule Take 50,000 Units by mouth once a week.   [DISCONTINUED] VRAYLAR 1.5 MG capsule Take 1.5 mg by mouth daily.   No facility-administered encounter medications on file as of 10/07/2023.    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   FOOT SURGERY Bilateral    LAPAROSCOPY N/A 05/21/2019   Procedure: LAPAROSCOPY DIAGNOSTIC, Left Pelvic Cystectomy ;  Surgeon: Hermina Staggers, MD;  Location: [facility];  Service: Gynecology;  Laterality: N/A;   SKIN GRAFT Right 2000   abdominal graft for foot   THROAT SURGERY     TONSILLECTOMY      Family History  Problem  Relation Age of Onset   Anxiety disorder Mother    Alcohol abuse Father    Arthritis Father     Social History   Socioeconomic History   Marital status: Single    Spouse name: Not on file   Number of children: Not on file   Years of education: Not on file   Highest education level: Some college, no degree  Occupational History   Not on file  Tobacco Use   Smoking status: Every Day    Types: E-cigarettes    Passive exposure: Past   Smokeless tobacco: Former    Types: Chew    Quit date: 01/2021   Tobacco comments:    I have vaped for a year. One will last me a couple of weeks  Vaping Use   Vaping status: Some Days   Substances: Nicotine, Flavoring  Substance and Sexual Activity  Alcohol use: Yes    Alcohol/week: 3.0 standard drinks of alcohol    Types: 3 Glasses of wine per week    Comment: Sometimes i have a glass after work   Drug use: Never   Sexual activity: Yes    Partners: Female    Birth control/protection: Other-see comments, None    Comment: Ive been with a woman for 10 years  Other Topics Concern   Not on file  Social History Narrative   Not on file   Social Drivers of Health   Financial Resource Strain: High Risk (10/04/2023)   Overall Financial Resource Strain (CARDIA)    Difficulty of Paying Living Expenses: Very hard  Food Insecurity: Food Insecurity Present (10/04/2023)   Hunger Vital Sign    Worried About Running Out of Food in the Last Year: Often true    Ran Out of Food in the Last Year: Often true  Transportation Needs: Unmet Transportation Needs (10/04/2023)   PRAPARE - Transportation    Lack of Transportation (Medical): Yes    Lack of Transportation (Non-Medical): Yes  Physical Activity: Insufficiently Active (10/04/2023)   Exercise Vital Sign    Days of Exercise per Week: 1 day    Minutes of Exercise per Session: 60 min  Stress: Stress Concern Present (10/04/2023)   [employer] of Occupational Health - Occupational Stress Questionnaire     Feeling of Stress : Very much  Social Connections: Moderately Isolated (10/04/2023)   Social Connection and Isolation Panel [NHANES]    Frequency of Communication with Friends and Family: More than three times a week    Frequency of Social Gatherings with Friends and Family: Never    Attends Religious Services: Never    Database administrator or Organizations: No    Attends Engineer, structural: Not on file    Marital Status: Living with partner  Intimate Partner Violence: Unknown (01/28/2023)   Received from [employer], [facility]   HITS    Physically Hurt: Not on file    Insult or Talk Down To: Not on file    Threaten Physical Harm: Not on file    Scream or Curse: Not on file    ROS: as noted in HPI  Objective:  BP 132/87   Pulse 80   Ht 5\' 6"  (1.676 m)   Wt 215 lb 1.9 oz (97.6 kg)   LMP 09/25/2023 (Exact Date)   SpO2 99%   BMI 34.72 kg/m   Physical Exam Constitutional:      General: She is not in acute distress.    Appearance: Normal appearance. She is obese. She is not ill-appearing, toxic-appearing or diaphoretic.  HENT:     Head: Normocephalic and atraumatic.     Nose: Nose normal.     Mouth/Throat:     Mouth: Mucous membranes are moist.     Pharynx: No oropharyngeal exudate.  Eyes:     General: No scleral icterus.       Right eye: No discharge.        Left eye: No discharge.  Cardiovascular:     Rate and Rhythm: Normal rate and regular rhythm.     Pulses: Normal pulses.  Pulmonary:     Effort: Pulmonary effort is normal. No respiratory distress.  Musculoskeletal:        General: Tenderness (primarily to R inferior patellar tendon; some discomfort to LCL on R without laxity) present. No swelling. Normal range of motion.     Right lower leg:  No edema.     Left lower leg: No edema.     Comments: Chronic L sided UE and LE enlargement  Skin:    General: Skin is warm and dry.     Coloration: Skin is not jaundiced.     Comments: Generalized port  wine stains Significant inflamed varicosities noted primarily to lower extremities  Neurological:     General: No focal deficit present.     Mental Status: She is alert and oriented to person, place, and time.     Cranial Nerves: No cranial nerve deficit.  Psychiatric:        Mood and Affect: Mood normal.        Behavior: Behavior normal.        Thought Content: Thought content normal.        10/07/2023   11:19 AM  GAD 7 : Generalized Anxiety Score  Nervous, Anxious, on Edge 3  Control/stop worrying 3  Worry too much - different things 3  Trouble relaxing 3  Restless 2  Easily annoyed or irritable 3  Afraid - awful might happen 3  Total GAD 7 Score 20  Anxiety Difficulty moderate      Assessment & Plan:  Hypothyroidism, unspecified type -     TSH -     T4, free -     T3 -     CBC with Differential/Platelet -     Hemoglobin A1c -     Comprehensive metabolic panel  Chronic pain syndrome -     Gabapentin; Take 1 capsule (100 mg total) by mouth 2 (two) times daily.  Dispense: 60 capsule; Refill: 2 -     DG Knee Complete 4 Views Right; Future  Acute pain of right knee -     DG Knee Complete 4 Views Right; Future  Bipolar 1 disorder (HCC) -     CBC with Differential/Platelet -     Hemoglobin A1c -     Comprehensive metabolic panel  History of anemia -     Comprehensive metabolic panel  Hypocalcemia -     CBC with Differential/Platelet -     Comprehensive metabolic panel  Diabetes mellitus screening -     Hemoglobin A1c  Generalized anxiety disorder -     FLUoxetine HCl; Take 1 capsule (20 mg total) by mouth daily.  Dispense: 90 capsule; Refill: 3  Other obsessive-compulsive disorders -     FLUoxetine HCl; Take 1 capsule (20 mg total) by mouth daily.  Dispense: 90 capsule; Refill: 3  Patellar tendinitis, right knee  Other insomnia  Panic attack  Klippel-Trenaunay-Weber syndrome  OSA (obstructive sleep apnea)  Assessment and Plan    Right Knee  Pain Likely inferior patellar tendonitis based on location of pain and response to treatment. No evidence of DVT on venous Doppler. -Continue diclofenac and Soma (carisoprodol) as prescribed. -Order knee x-ray to rule out bony abnormalities if pain persists after completion of diclofenac course.  Generalized Anxiety Disorder and Obsessive Compulsive Disorder Frequent panic attacks and obsessive behaviors reported. History of bipolar disorder and previous treatment with Paxil (paroxetine). -Start fluoxetine for anxiety and OCD symptoms. -Start hydroxyzine as needed for acute panic attacks. -Schedule follow-up in 2-3 weeks to assess response to treatment.  Chronic Pain History of chronic pain in various locations. -Start gabapentin (Neurontin) 100mg  twice daily, with potential to increase to three times daily if needed.  Hypothyroidism History of hypothyroidism, currently off levothyroxine due to insurance issues. Recent weight gain reported. -Order  thyroid function tests. -Consider restarting levothyroxine pending lab results.  Sleep Disturbance Difficulty falling asleep and staying asleep reported. History of sleep apnea. -Advise to continue current sleep hygiene practices. -Consider potential benefits of gabapentin and fluoxetine on sleep. -Plan to reassess sleep issues at follow-up visit.  Anemia History of anemia. -Order CBC to assess current hemoglobin levels.        Return in about 3 weeks (around 10/28/2023).   Maretta Bees, PA

## 2023-10-08 LAB — HEMOGLOBIN A1C: Hgb A1c MFr Bld: 5.1 % (ref 4.6–6.5)

## 2023-10-08 LAB — T3: T3, Total: 92 ng/dL (ref 76–181)

## 2023-10-20 ENCOUNTER — Encounter: Payer: Self-pay | Admitting: Urgent Care

## 2023-10-28 ENCOUNTER — Ambulatory Visit: Payer: BLUE CROSS/BLUE SHIELD | Admitting: Urgent Care

## 2023-10-30 ENCOUNTER — Ambulatory Visit (INDEPENDENT_AMBULATORY_CARE_PROVIDER_SITE_OTHER): Admitting: Urgent Care

## 2023-10-30 ENCOUNTER — Encounter: Payer: Self-pay | Admitting: Urgent Care

## 2023-10-30 VITALS — BP 123/74 | HR 68 | Ht 66.0 in | Wt 212.0 lb

## 2023-10-30 DIAGNOSIS — F319 Bipolar disorder, unspecified: Secondary | ICD-10-CM

## 2023-10-30 DIAGNOSIS — Q872 Congenital malformation syndromes predominantly involving limbs: Secondary | ICD-10-CM

## 2023-10-30 DIAGNOSIS — F411 Generalized anxiety disorder: Secondary | ICD-10-CM | POA: Diagnosis not present

## 2023-10-30 DIAGNOSIS — E039 Hypothyroidism, unspecified: Secondary | ICD-10-CM

## 2023-10-30 DIAGNOSIS — F4310 Post-traumatic stress disorder, unspecified: Secondary | ICD-10-CM | POA: Diagnosis not present

## 2023-10-30 DIAGNOSIS — G894 Chronic pain syndrome: Secondary | ICD-10-CM | POA: Diagnosis not present

## 2023-10-30 DIAGNOSIS — F410 Panic disorder [episodic paroxysmal anxiety] without agoraphobia: Secondary | ICD-10-CM

## 2023-10-30 DIAGNOSIS — G4709 Other insomnia: Secondary | ICD-10-CM

## 2023-10-30 MED ORDER — HYDROXYZINE PAMOATE 25 MG PO CAPS: 25.0000 mg | ORAL_CAPSULE | Freq: Three times a day (TID) | ORAL | 0 refills | Status: AC | PRN

## 2023-10-30 MED ORDER — FLUOXETINE HCL 40 MG PO CAPS: 40.0000 mg | ORAL_CAPSULE | Freq: Every day | ORAL | 3 refills | Status: AC

## 2023-10-30 MED ORDER — CELECOXIB 200 MG PO CAPS: 200.0000 mg | ORAL_CAPSULE | Freq: Two times a day (BID) | ORAL | 3 refills | Status: AC

## 2023-10-30 MED ORDER — GABAPENTIN 300 MG PO CAPS: 300.0000 mg | ORAL_CAPSULE | Freq: Three times a day (TID) | ORAL | 3 refills | Status: AC
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
## 2023-10-30 NOTE — Patient Instructions (Signed)
 Increase fluoxetine to 40mg  daily. Increase gabapentin to 300mg  up to three times daily as needed. This medication can cause drowsiness.  Take hydroxyzine as needed for panic, up to every 8 hours. Take with caution as this medication can make you sleepy.  Stop diclofenac, switch to celebrex. Take this twice daily with food.  Look into purchasing the Calmigo device to help with anxiety, PTSD, etc.  Please return in 6 weeks - come fasting for repeat thyroid testing

## 2023-12-09 ENCOUNTER — Ambulatory Visit: Admitting: Urgent Care

## 2023-12-23 ENCOUNTER — Encounter: Payer: Self-pay | Admitting: Urgent Care

## 2024-01-05 ENCOUNTER — Ambulatory Visit: Admitting: Urgent Care

## 2024-01-08 ENCOUNTER — Ambulatory Visit: Payer: Self-pay | Admitting: Urgent Care

## 2024-01-08 ENCOUNTER — Other Ambulatory Visit: Payer: Self-pay | Admitting: Urgent Care

## 2024-01-08 ENCOUNTER — Ambulatory Visit (INDEPENDENT_AMBULATORY_CARE_PROVIDER_SITE_OTHER): Admitting: Urgent Care

## 2024-01-08 ENCOUNTER — Ambulatory Visit: Attending: Urgent Care

## 2024-01-08 ENCOUNTER — Encounter: Payer: Self-pay | Admitting: Urgent Care

## 2024-01-08 VITALS — Temp 98.2°F | Wt 226.1 lb

## 2024-01-08 DIAGNOSIS — R011 Cardiac murmur, unspecified: Secondary | ICD-10-CM | POA: Diagnosis not present

## 2024-01-08 DIAGNOSIS — R55 Syncope and collapse: Secondary | ICD-10-CM

## 2024-01-08 DIAGNOSIS — R42 Dizziness and giddiness: Secondary | ICD-10-CM | POA: Diagnosis not present

## 2024-01-08 DIAGNOSIS — E039 Hypothyroidism, unspecified: Secondary | ICD-10-CM

## 2024-01-08 DIAGNOSIS — R0989 Other specified symptoms and signs involving the circulatory and respiratory systems: Secondary | ICD-10-CM

## 2024-01-08 DIAGNOSIS — S0990XA Unspecified injury of head, initial encounter: Secondary | ICD-10-CM | POA: Diagnosis not present

## 2024-01-08 DIAGNOSIS — I6523 Occlusion and stenosis of bilateral carotid arteries: Secondary | ICD-10-CM

## 2024-01-08 LAB — CBC WITH DIFFERENTIAL/PLATELET
Basophils Absolute: 0 10*3/uL (ref 0.0–0.1)
Basophils Relative: 0.5 % (ref 0.0–3.0)
Eosinophils Absolute: 0.1 10*3/uL (ref 0.0–0.7)
Eosinophils Relative: 1.5 % (ref 0.0–5.0)
HCT: 36.9 % (ref 36.0–46.0)
Hemoglobin: 12.3 g/dL (ref 12.0–15.0)
Lymphocytes Relative: 8.8 % — ABNORMAL LOW (ref 12.0–46.0)
Lymphs Abs: 0.5 10*3/uL — ABNORMAL LOW (ref 0.7–4.0)
MCHC: 33.5 g/dL (ref 30.0–36.0)
MCV: 96.5 fl (ref 78.0–100.0)
Monocytes Absolute: 0.3 10*3/uL (ref 0.1–1.0)
Monocytes Relative: 4.9 % (ref 3.0–12.0)
Neutro Abs: 4.9 10*3/uL (ref 1.4–7.7)
Neutrophils Relative %: 84.3 % — ABNORMAL HIGH (ref 43.0–77.0)
Platelets: 175 10*3/uL (ref 150.0–400.0)
RBC: 3.83 Mil/uL — ABNORMAL LOW (ref 3.87–5.11)
RDW: 15 % (ref 11.5–15.5)
WBC: 5.9 10*3/uL (ref 4.0–10.5)

## 2024-01-08 LAB — COMPREHENSIVE METABOLIC PANEL WITH GFR
ALT: 7 U/L (ref 0–35)
AST: 9 U/L (ref 0–37)
Albumin: 3.9 g/dL (ref 3.5–5.2)
Alkaline Phosphatase: 73 U/L (ref 39–117)
BUN: 13 mg/dL (ref 6–23)
CO2: 28 meq/L (ref 19–32)
Calcium: 8.5 mg/dL (ref 8.4–10.5)
Chloride: 103 meq/L (ref 96–112)
Creatinine, Ser: 0.85 mg/dL (ref 0.40–1.20)
GFR: 90.68 mL/min (ref >60.00)
Glucose, Bld: 79 mg/dL (ref 70–99)
Potassium: 3.9 meq/L (ref 3.5–5.1)
Sodium: 137 meq/L (ref 135–145)
Total Bilirubin: 0.8 mg/dL (ref 0.2–1.2)
Total Protein: 6.2 g/dL (ref 6.0–8.3)

## 2024-01-08 LAB — TSH: TSH: 3.2 u[IU]/mL (ref 0.35–5.50)

## 2024-01-08 LAB — T4, FREE: Free T4: 0.73 ng/dL (ref 0.60–1.60)

## 2024-01-08 LAB — MAGNESIUM: Magnesium: 1.8 mg/dL (ref 1.5–2.5)

## 2024-01-08 NOTE — Patient Instructions (Addendum)
 Please cut back/ stop nicotine containing vapes. Continue to drink plenty of water.  I have ordered the following to further assess your syncope: Zio patch - 14 day heart monitor Carotid ultrasound and ECHO Head CT  Please also call and schedule a follow up with cardiology.  I ordered labs and will contact you via Mychart to discuss the results.

## 2024-01-08 NOTE — Progress Notes (Unsigned)
 EP to read.

## 2024-01-08 NOTE — Progress Notes (Signed)
 Established Patient Office Visit  Subjective:  Patient ID: Frances Reed, female    DOB: 1990/10/07  Age: 33 y.o. MRN: [id number]  Chief Complaint  Patient presents with   Loss of Consciousness    Often; last episode last night    Loss of Consciousness    Discussed the use of AI scribe software for clinical note transcription with the patient, who gave verbal consent to proceed.  History of Present Illness   Frances Reed is a 33 year old female with syncope who presents with recurrent episodes of passing out.  She experiences recurrent episodes of syncope, primarily when standing or moving. These episodes are sometimes preceded by dizziness, but she often does not remember the events leading up to the loss of consciousness. During these episodes, she is completely unaware of her surroundings. No associated seizure-like activity, abnormal movements, stiffness, incontinence, or drooling. The episodes began back in 2016, ceased for a period, and resumed approximately a month and a half ago. A significant episode in May 2023 led to an ER visit. 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  She has experienced head trauma during some episodes, including a recent incident resulting in epistaxis and facial soreness. New onset nausea, particularly when eating, is associated with the recent head trauma. She has hit her head twice in the past week.  Her current medications include levothyroxine  50 mcg daily, gabapentin  300 mg three times daily, fluoxetine  40 mg daily, and Celebrex  200 mg twice daily. She uses hydroxyzine  for anxiety as needed, but it causes significant sedation, leading to very seldom use.   She drinks two to three bottles of water at work but admits to inadequate hydration during the day at home. She consumes one energy  drink per day on workdays and denies other caffeine  sources. She has a history of vaping with nicotine for the past two years.  She has a history of seizures as an infant related to her syndrome but has not experienced any since. She has not been diagnosed with POTS or undergone a full cardiac workup for it.      Patient Active Problem List   Diagnosis Date Noted   Hypocalcemia 10/07/2023   History of anemia 10/07/2023   Bipolar 1 disorder (HCC) 10/07/2023   Acute pain of right knee 10/07/2023   Chronic pain syndrome 10/07/2023   Hypothyroidism 10/07/2023   Obsessive compulsive disorder 10/07/2023   Patellar tendinitis, right knee 10/07/2023   Other insomnia 10/07/2023   Panic attack 10/07/2023   OSA (obstructive sleep apnea) 10/07/2023   Palpitations 10/20/2019   Precordial chest pain 09/20/2019   Educated about COVID-19 virus infection 09/20/2019   LGSIL on Pap smear of cervix 08/06/2019   Pain of right lower leg 10/18/2015   Generalized anxiety disorder 06/13/2015   Carpal tunnel syndrome 01/18/2015   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 One will last me a couple of weeks  Vaping Use   Vaping status: Some Days   Substances: Nicotine, Flavoring  Substance Use Topics   Alcohol use: Yes    Alcohol/week: 3.0 standard drinks of alcohol    Types: 3 Glasses of wine per week    Comment: Sometimes i have a glass after work   Drug use: Never      ROS: as noted in HPI  Objective:     Temp 98.2 F (36.8 C)   Wt 226 lb 1.9 oz (102.6 kg)   LMP 01/01/2024 (Approximate)   SpO2 97%   BMI 36.50 kg/m  BP Readings from Last 3 Encounters:  10/30/23 123/74  10/07/23 132/87  10/02/23 126/84   Wt Readings from Last 3 Encounters:  01/08/24 226 lb 1.9 oz (102.6 kg)  10/30/23 212 lb (96.2 kg)  10/07/23 215 lb 1.9 oz (97.6 kg)    Orthostatic Vitals for the past 48 hrs (Last 6 readings):  Patient Position Orthostatic BP Orthostatic Pulse BP Location Cuff Size  01/08/24 0948 Sitting 121/82 105 Right Arm Large  01/08/24 0954 Standing 131/90 104 Right Arm Large  01/08/24 0957 Supine 124/82 87 Right Arm Large        Physical Exam Vitals and nursing note reviewed. Exam conducted with a chaperone present.  Constitutional:      General: She is not in acute distress.    Appearance: Normal appearance. She is obese. She is not ill-appearing, toxic-appearing or diaphoretic.  HENT:     Head: Normocephalic and atraumatic.     Nose: Nose normal.     Mouth/Throat:     Mouth: Mucous membranes are moist.     Pharynx: No oropharyngeal exudate.  Eyes:     General: No scleral icterus.       Right eye: No discharge.        Left eye: No discharge.  Neck:     Vascular: Carotid bruit (L) present.  Cardiovascular:     Rate and Rhythm: Normal rate and regular rhythm.     Pulses: Normal pulses.     Heart sounds: Murmur (grade 1-2/6 murmur heard at both mitral and  tricuspid auscultation points; radiation to carotid) heard.  Pulmonary:     Effort: Pulmonary effort is normal. No respiratory distress.     Breath sounds: Normal breath sounds. No stridor. No wheezing or rhonchi.  Musculoskeletal:     Cervical back: Neck supple. No rigidity.     Right lower leg: No edema.     Left lower leg: No edema.     Comments: Chronic L sided UE and LE enlargement  Lymphadenopathy:     Cervical: No cervical adenopathy.  Skin:    General: Skin is warm and  dry.     Coloration: Skin is not jaundiced.     Comments: Generalized port wine stains Significant inflamed varicosities noted primarily to lower extremities  Neurological:     General: No focal deficit present.     Mental Status: She is alert and oriented to person, place, and time.     Cranial Nerves: No cranial nerve deficit.  Psychiatric:        Mood and Affect: Mood normal.        Behavior: Behavior normal.        Thought Content: Thought content normal.      No results found for any visits on 01/08/24.  Last CBC Lab Results  Component Value Date   WBC 5.0 10/07/2023   HGB 13.1 10/07/2023   HCT 39.1 10/07/2023   MCV 95.4 10/07/2023   MCH 30.8 04/22/2022   RDW 15.6 (H) 10/07/2023   PLT 201.0 10/07/2023   Last metabolic panel Lab Results  Component Value Date   GLUCOSE 84 10/07/2023   NA 134 (L) 10/07/2023   K 4.3 10/07/2023   CL 103 10/07/2023   CO2 28 10/07/2023   BUN 14 10/07/2023   CREATININE 0.94 10/07/2023   GFR 80.51 10/07/2023   CALCIUM 8.5 10/07/2023   PROT 6.6 10/07/2023   ALBUMIN 4.2 10/07/2023   BILITOT 0.7 10/07/2023   ALKPHOS 63 10/07/2023   AST 11 10/07/2023   ALT 7 10/07/2023   ANIONGAP 10 04/22/2022   Last lipids No results found for: "CHOL", "HDL", "LDLCALC", "LDLDIRECT", "TRIG", "CHOLHDL" Last hemoglobin A1c Lab Results  Component Value Date   HGBA1C 5.1 10/07/2023   Last thyroid functions Lab Results  Component Value Date   TSH 9.03 (H) 10/07/2023    T3TOTAL 92 10/07/2023   Last vitamin D No results found for: "25OHVITD2", "25OHVITD3", "VD25OH" Last vitamin B12 and Folate No results found for: "VITAMINB12", "FOLATE"    The ASCVD Risk score (Arnett DK, et al., 2019) failed to calculate for the following reasons:   The 2019 ASCVD risk score is only valid for ages 46 to 65  Assessment & Plan:  Syncope, unspecified syncope type -     CT HEAD WO CONTRAST ( ); Future -     LONG TERM MONITOR (3-14 DAYS); Future -     VAS US  CAROTID; Future -     CBC with Differential/Platelet -     Comprehensive metabolic panel with GFR -     Iron, TIBC and Ferritin Panel -     Magnesium -     Ambulatory referral to Cardiology  Dizziness -     CT HEAD WO CONTRAST ( ); Future -     LONG TERM MONITOR (3-14 DAYS); Future -     ECHOCARDIOGRAM COMPLETE; Future -     Ambulatory referral to Cardiology  Traumatic injury of head, initial encounter -     CT HEAD WO CONTRAST ( ); Future  Hypothyroidism, unspecified type -     TSH -     T4, free -     T3  Heart murmur -     T3 -     ECHOCARDIOGRAM COMPLETE; Future -     CBC with Differential/Platelet -     Comprehensive metabolic panel with GFR -     Iron, TIBC and Ferritin Panel -     Magnesium -     Ambulatory referral to Cardiology  Bruit of left carotid artery -     VAS US  CAROTID; Future -  Ambulatory referral to Cardiology  Assessment and Plan    Syncope and collapse Recurrent syncope during orthostatic changes with dizziness and nausea. Differential includes POTS vs other cardiac abnormality. Recent head trauma noted. New heart murmur detected and possible bruit on L (vs radiation from heart valve). Dehydration considered a factor. - Order CT of the head to rule out intracranial hemorrhage. - Order 14-day heart monitor (zio patch). Previously completed per pt, however no results available. Appears it was never read - Order echocardiogram to evaluate heart murmur. - Order  carotid artery ultrasound. - Refer to cardiology for comprehensive POTS evaluation. - Advise increased hydration to at least 3 liters per day.  Dizziness Intermittent. Borderline orthostatic hypotension with pulse rate fluctuations. Dehydration considered a factor. - Advise increased hydration to at least 3 liters per day. - CT head - labs today  Head trauma with epistaxis Recent head trauma with epistaxis and facial soreness. - Order CT of the head to rule out intracranial hemorrhage.  Hypothyroidism Managed with levothyroxine . Recent labs showed abnormal thyroid levels. Missed one dose recently. - Recheck thyroid function tests.  Anxiety disorder Managed with hydroxyzine  as needed. Reports sedation limiting use. - Continue hydroxyzine  as needed for anxiety, with caution regarding sedation.  Nicotine dependence Nicotine dependence due to vaping. Acknowledges need to quit. - Encourage smoking cessation and discuss potential strategies for quitting.      Outside records and reports dating back to 2016 were reviewed and interpreted by myself in office. Total time spent face to face, reviewing and interpreting outside records, documentation and coordinating plan of care was 48 minutes.   No follow-ups on file.   Mandy Second, PA

## 2024-01-09 LAB — IRON,TIBC AND FERRITIN PANEL
%SAT: 17 % (ref 16–45)
Ferritin: 65 ng/mL (ref 16–154)
Iron: 60 ug/dL (ref 40–190)
TIBC: 347 ug/dL (ref 250–450)

## 2024-01-09 LAB — T3: T3, Total: 85 ng/dL (ref 76–181)

## 2024-01-12 ENCOUNTER — Other Ambulatory Visit: Payer: Self-pay

## 2024-01-12 ENCOUNTER — Encounter: Payer: Self-pay | Admitting: *Deleted

## 2024-01-12 DIAGNOSIS — E039 Hypothyroidism, unspecified: Secondary | ICD-10-CM

## 2024-01-12 MED ORDER — LEVOTHYROXINE SODIUM 50 MCG PO TABS
50.0000 ug | ORAL_TABLET | Freq: Every day | ORAL | 0 refills | Status: AC
Start: 2024-01-12 — End: ?

## 2024-01-13 ENCOUNTER — Ambulatory Visit (HOSPITAL_COMMUNITY): Attending: Urgent Care

## 2024-01-21 ENCOUNTER — Telehealth: Payer: Self-pay

## 2024-01-21 NOTE — Telephone Encounter (Signed)
 Copied from CRM [id number]. Topic: General - Other >> Jan 16, 2024  3:23 PM Baldo Levan wrote: Reason for CRM: Patients spouse calling in to let the doctor know the MRI is scheduled for Thursday at 9:30 am

## 2024-01-21 NOTE — Telephone Encounter (Signed)
 Noted! Thank you

## 2024-01-26 ENCOUNTER — Telehealth: Payer: Self-pay

## 2024-01-26 NOTE — Telephone Encounter (Signed)
 Please notify pt that the CT appears to be normal with no abnormalities noted.

## 2024-01-26 NOTE — Telephone Encounter (Signed)
 Please advise  Copied from CRM [id number]. Topic: Clinical - Lab/Test Results >> Jan 26, 2024 12:34 PM Frances Reed wrote: Reason for CRM: Patient request a call back to her wife Frances Reed at [phone] to discuss  results from CT scan

## 2024-01-26 NOTE — Telephone Encounter (Signed)
 Left pt a Vm to discuss

## 2024-02-05 ENCOUNTER — Ambulatory Visit (HOSPITAL_COMMUNITY)
Admission: RE | Admit: 2024-02-05 | Discharge: 2024-02-05 | Disposition: A | Discharge status: Home / Self Care | Source: Ambulatory Visit | Attending: Vascular Surgery | Admitting: Vascular Surgery

## 2024-02-05 DIAGNOSIS — R0989 Other specified symptoms and signs involving the circulatory and respiratory systems: Secondary | ICD-10-CM | POA: Diagnosis not present

## 2024-02-05 DIAGNOSIS — R55 Syncope and collapse: Secondary | ICD-10-CM | POA: Diagnosis not present

## 2024-02-22 ENCOUNTER — Emergency Department (HOSPITAL_COMMUNITY)

## 2024-02-22 ENCOUNTER — Encounter (HOSPITAL_COMMUNITY): Payer: Self-pay | Admitting: Emergency Medicine

## 2024-02-22 ENCOUNTER — Emergency Department (HOSPITAL_COMMUNITY)
Admission: EM | Admit: 2024-02-22 | Discharge: 2024-02-22 | Discharge status: Against Medical Advice | Attending: Emergency Medicine | Admitting: Emergency Medicine

## 2024-02-22 DIAGNOSIS — Z5321 Procedure and treatment not carried out due to patient leaving prior to being seen by health care provider: Secondary | ICD-10-CM | POA: Diagnosis not present

## 2024-02-22 DIAGNOSIS — W228XXA Striking against or struck by other objects, initial encounter: Secondary | ICD-10-CM | POA: Insufficient documentation

## 2024-02-22 DIAGNOSIS — R55 Syncope and collapse: Secondary | ICD-10-CM | POA: Insufficient documentation

## 2024-02-22 LAB — COMPREHENSIVE METABOLIC PANEL WITH GFR
ALT: 13 U/L (ref 0–44)
AST: 14 U/L — ABNORMAL LOW (ref 15–41)
Albumin: 3.6 g/dL (ref 3.5–5.0)
Alkaline Phosphatase: 63 U/L (ref 38–126)
Anion gap: 9 (ref 5–15)
BUN: 6 mg/dL (ref 6–20)
CO2: 21 mmol/L — ABNORMAL LOW (ref 22–32)
Calcium: 8.8 mg/dL — ABNORMAL LOW (ref 8.9–10.3)
Chloride: 110 mmol/L (ref 98–111)
Creatinine, Ser: 0.86 mg/dL (ref 0.44–1.00)
GFR, Estimated: >60 mL/min (ref >60)
Glucose, Bld: 105 mg/dL — ABNORMAL HIGH (ref 70–99)
Potassium: 4.2 mmol/L (ref 3.5–5.1)
Sodium: 140 mmol/L (ref 135–145)
Total Bilirubin: 0.5 mg/dL (ref 0.0–1.2)
Total Protein: 6.7 g/dL (ref 6.5–8.1)

## 2024-02-22 LAB — CBC
HCT: 39.4 % (ref 36.0–46.0)
Hemoglobin: 13.1 g/dL (ref 12.0–15.0)
MCH: 32.8 pg (ref 26.0–34.0)
MCHC: 33.2 g/dL (ref 30.0–36.0)
MCV: 98.7 fL (ref 80.0–100.0)
Platelets: 195 10*3/uL (ref 150–400)
RBC: 3.99 MIL/uL (ref 3.87–5.11)
RDW: 14.6 % (ref 11.5–15.5)
WBC: 4.3 10*3/uL (ref 4.0–10.5)
nRBC: 0 % (ref 0.0–0.2)

## 2024-02-22 LAB — URINALYSIS, ROUTINE W REFLEX MICROSCOPIC
Bilirubin Urine: NEGATIVE
Glucose, UA: NEGATIVE mg/dL
Ketones, ur: NEGATIVE mg/dL
Leukocytes,Ua: NEGATIVE
Nitrite: NEGATIVE
Protein, ur: NEGATIVE mg/dL
Specific Gravity, Urine: 1.008 (ref 1.005–1.030)
pH: 5 (ref 5.0–8.0)

## 2024-02-22 LAB — HCG, SERUM, QUALITATIVE: Preg, Serum: NEGATIVE

## 2024-02-22 NOTE — ED Triage Notes (Signed)
 Pt here from home with c/o syncopal episode times 2 today , refused ems transport this morning , pt has hx of anxiety and potts
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
## 2024-02-22 NOTE — ED Notes (Signed)
 Pt left Ed

## 2024-03-04 ENCOUNTER — Ambulatory Visit (HOSPITAL_COMMUNITY)

## 2024-03-11 ENCOUNTER — Other Ambulatory Visit: Payer: Self-pay | Admitting: Urgent Care

## 2024-03-11 ENCOUNTER — Ambulatory Visit: Payer: Self-pay

## 2024-03-11 DIAGNOSIS — R55 Syncope and collapse: Secondary | ICD-10-CM

## 2024-03-11 NOTE — Telephone Encounter (Signed)
 Pt was called and a detailed message was left regarding provider notes

## 2024-03-11 NOTE — Telephone Encounter (Signed)
 Her carotid US  was actually read it error - it is actually normal. CT head was normal. I will place an urgent cardiology referral for continude symptoms.
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
## 2024-04-14 ENCOUNTER — Ambulatory Visit (HOSPITAL_COMMUNITY): Admission: RE | Admit: 2024-04-14 | Source: Ambulatory Visit | Attending: Urgent Care | Admitting: Urgent Care

## 2024-04-19 ENCOUNTER — Encounter (HOSPITAL_COMMUNITY): Payer: Self-pay | Admitting: Urgent Care

## 2024-04-30 ENCOUNTER — Emergency Department (HOSPITAL_COMMUNITY)
Admission: EM | Admit: 2024-04-30 | Discharge: 2024-05-01 | Disposition: A | Discharge status: Home / Self Care | Attending: Emergency Medicine | Admitting: Emergency Medicine

## 2024-04-30 ENCOUNTER — Other Ambulatory Visit: Payer: Self-pay

## 2024-04-30 ENCOUNTER — Emergency Department (HOSPITAL_COMMUNITY)

## 2024-04-30 DIAGNOSIS — R109 Unspecified abdominal pain: Secondary | ICD-10-CM | POA: Diagnosis not present

## 2024-04-30 DIAGNOSIS — R42 Dizziness and giddiness: Secondary | ICD-10-CM | POA: Insufficient documentation

## 2024-04-30 DIAGNOSIS — R55 Syncope and collapse: Secondary | ICD-10-CM | POA: Diagnosis not present

## 2024-04-30 DIAGNOSIS — M545 Low back pain, unspecified: Secondary | ICD-10-CM | POA: Insufficient documentation

## 2024-04-30 LAB — CBC
HCT: 39.2 % (ref 36.0–46.0)
Hemoglobin: 13.1 g/dL (ref 12.0–15.0)
MCH: 32.9 pg (ref 26.0–34.0)
MCHC: 33.4 g/dL (ref 30.0–36.0)
MCV: 98.5 fL (ref 80.0–100.0)
Platelets: 198 K/uL (ref 150–400)
RBC: 3.98 MIL/uL (ref 3.87–5.11)
RDW: 14.4 % (ref 11.5–15.5)
WBC: 5 K/uL (ref 4.0–10.5)
nRBC: 0 % (ref 0.0–0.2)

## 2024-04-30 LAB — URINALYSIS, ROUTINE W REFLEX MICROSCOPIC
Bacteria, UA: NONE SEEN
Bilirubin Urine: NEGATIVE
Glucose, UA: NEGATIVE mg/dL
Ketones, ur: NEGATIVE mg/dL
Leukocytes,Ua: NEGATIVE
Nitrite: NEGATIVE
Protein, ur: NEGATIVE mg/dL
Specific Gravity, Urine: 1.019 (ref 1.005–1.030)
pH: 5 (ref 5.0–8.0)

## 2024-04-30 LAB — BASIC METABOLIC PANEL WITH GFR
Anion gap: 15 (ref 5–15)
BUN: 10 mg/dL (ref 6–20)
CO2: 16 mmol/L — ABNORMAL LOW (ref 22–32)
Calcium: 8.6 mg/dL — ABNORMAL LOW (ref 8.9–10.3)
Chloride: 107 mmol/L (ref 98–111)
Creatinine, Ser: 0.91 mg/dL (ref 0.44–1.00)
GFR, Estimated: >60 mL/min (ref >60)
Glucose, Bld: 103 mg/dL — ABNORMAL HIGH (ref 70–99)
Potassium: 4 mmol/L (ref 3.5–5.1)
Sodium: 138 mmol/L (ref 135–145)

## 2024-04-30 LAB — TROPONIN I (HIGH SENSITIVITY)
Troponin I (High Sensitivity): <2 ng/L (ref <18)
Troponin I (High Sensitivity): 2 ng/L (ref <18)

## 2024-04-30 LAB — HCG, SERUM, QUALITATIVE: Preg, Serum: NEGATIVE

## 2024-04-30 LAB — LIPASE, BLOOD: Lipase: 27 U/L (ref 11–51)

## 2024-04-30 MED ORDER — MORPHINE SULFATE (PF) 4 MG/ML IV SOLN
8.0000 mg | Freq: Once | INTRAVENOUS | Status: AC
  Administered 2024-04-30: 8 mg via INTRAVENOUS
  Filled 2024-04-30: qty 2

## 2024-04-30 MED ORDER — KETOROLAC TROMETHAMINE 15 MG/ML IJ SOLN
15.0000 mg | Freq: Once | INTRAMUSCULAR | Status: AC
  Administered 2024-04-30: 15 mg via INTRAVENOUS
  Filled 2024-04-30: qty 1

## 2024-04-30 MED ORDER — LACTATED RINGERS IV BOLUS
1000.0000 mL | Freq: Once | INTRAVENOUS | Status: AC
  Administered 2024-04-30: 1000 mL via INTRAVENOUS

## 2024-04-30 MED ORDER — LIDOCAINE 5 % EX PTCH
1.0000 | MEDICATED_PATCH | CUTANEOUS | Status: DC
  Administered 2024-04-30: 1 via TRANSDERMAL
  Filled 2024-04-30: qty 1

## 2024-04-30 MED ORDER — HYDROCODONE-ACETAMINOPHEN 5-325 MG PO TABS
1.0000 | ORAL_TABLET | Freq: Once | ORAL | Status: AC
  Administered 2024-04-30: 1 via ORAL
  Filled 2024-04-30: qty 1

## 2024-04-30 NOTE — ED Notes (Signed)
 Patient transported to CT

## 2024-04-30 NOTE — ED Notes (Signed)
 Pt has had multiple episodes of emesis while in triage room.

## 2024-04-30 NOTE — ED Provider Notes (Signed)
 [place] [facility] Provider Note HPI Frances Reed is a 33 y.o. female with a medical history as below who presents to the emergency department with 1 week of worsening right lower back pain.  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  She does do lifting at work and is active at work.  She initially thought it might be muscular and might be due to a kidney stone.  She last had a kidney stone 10 years ago.  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  She falls approximately once a month.  She was in the grass when she felt dizzy and lowered onto her knees and then passed out on the grass.  She did not hit her head.  Upon waking, she noted worsening of the right-sided back pain.  Denies fevers.  Denies recent instrumentation to the back.  Denies IV drug use.  Denies radiation of the pain down to her legs.  Denies numbness in the legs or motor weakness.  Denies saddle anesthesia.  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 them   FOOT SURGERY Bilateral    LAPAROSCOPY N/A 05/21/2019   Procedure: LAPAROSCOPY DIAGNOSTIC, Left Pelvic Cystectomy ;  Surgeon: Lorence Ozell CROME, MD;  Location: [facility] OR;  Service: Gynecology;  Laterality: N/A;   SKIN GRAFT Right 2000   abdominal graft for foot   THROAT SURGERY     TONSILLECTOMY      Review of Systems Pertinent positives and negative findings are  listed as part of the History of Present Illness and MDM  Physical Exam Vitals:   04/30/24 2100 04/30/24 2156 04/30/24 2200 04/30/24 2258  BP: 118/68  111/69   Pulse: 77 92 83   Resp:   20   Temp:    99 F (37.2 C)  TempSrc:    Oral  SpO2: 100%  100%   Weight:      Height:         Constitutional Nursing notes reviewed Vital signs reviewed  HEENT No obvious trauma Pupils round, equal, and reactive to light. Pupils cross midline Neck supple  Respiratory Effort normal Breathing well on room air CTAB  CV Normal rate and rhythm   Abdomen Soft, non-tender, non-distended No peritonitis  MSK Right lumbar paraspinal muscle tenderness to palpation No midline C, T or L-spine tenderness to palpation  Neuro Cranial nerves II through XII intact Moving all 4 extremities with full strength    MDM:  Initial Differential Diagnoses includes muscular pain, radiculopathy, cauda equina, epidural hematoma, conus medullaris, discitis renal colic, cellulitis, hematoma, UTI, electrolyte abnormality, arrhythmia, conduction block, intra-abdominal pathology  I reviewed the patient's vitals, the nursing triage note and evaluated the patient at bedside.    Physical exam is most concerning for muscular pain.  The  patient has point tenderness over the right lumbar paraspinal muscles.  No overlying ecchymosis or hematoma formation.  No evidence of infection on skin exam.  She has no midline tenderness.  The pain does not radiate down her leg.  She has no motor weakness or numbness.  Denies saddle anesthesia.  Denies urinary/fecal incontinence.  She has no history of IV drug use or recent show mentation to the back.  She has no recent infectious symptoms including fever.  I have very low suspicion for life-threatening back pathology.  I considered intra-abdominal pathology.  The patient's abdomen is soft and nontender throughout.  She has no CVA tenderness to palpation.  The patient is writhing in pain and I  will obtain a CT study of her abdomen to evaluate for urolithiasis.  Labs overall reassuring with no evidence of anemia, leukocytosis or significant electrolyte abnormalities that would cause her symptoms.  Lipase within normal limits.  Pregnancy testing negative.  EKG reviewed by myself which shows sinus tachycardia with no evidence of acute ischemic changes, new onset arrhythmia or high-grade conduction block.  Patient given pain control and a bolus of fluids.  Patient signed out to the oncoming provider at 11:30 PM, pending CT scan.  Plan to discharge home if negative and pain is under control.  Plan to consult urology if stone is found for symptomatic stenting.  Also plan to follow-up on UA  Procedures: Procedures  Medications administered in the ED: Medications  lidocaine  (LIDODERM ) 5 % 1 patch (1 patch Transdermal Patch Applied 04/30/24 2119)  lactated ringers  bolus 1,000 mL (has no administration in time range)  HYDROcodone -acetaminophen  (NORCO/VICODIN) 5-325 MG per tablet 1 tablet (1 tablet Oral Given 04/30/24 2024)  ketorolac  (TORADOL ) 15 MG/ML injection 15 mg (15 mg Intravenous Given 04/30/24 2118)  morphine  (PF) 4 MG/ML injection 8 mg (8 mg Intravenous Given 04/30/24 2217)     Impression: 1. Acute right-sided low back pain without sciatica      Patient's presentation is most consistent with acute presentation with potential threat to life or bodily function.    ED Discharge Orders     None             Dionisio Blunt, MD 04/30/24 CLEMENTINE    Randol Simmonds, MD 05/01/24 1044

## 2024-04-30 NOTE — ED Provider Triage Note (Signed)
 Emergency Medicine Provider Triage Evaluation Note  Frances Reed , a 33 y.o. female  was evaluated in triage.  Pt complains of 1 week of intermittent right flank pain that is intermittent.  Today pain become constant and more severe after a fall due to LOC. 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 No seizure activity.  It is associated with some nausea.  She denies any chest pain, shortness of breath, dysuria, abdominal pain.  Review of Systems  Positive: LOC, back pain Negative: fever  Physical Exam  BP 132/81   Pulse 97   Temp 99.2 F (37.3 C) (Oral)   Resp (!) 22   Ht 5' 6 (1.676 m)   Wt 95.3 kg   SpO2 99%   BMI 33.89 kg/m  Gen:   Awake, no distress   Resp:  Normal effort  MSK:   Moves extremities without difficulty  Other:  No CVA tenderness, no reproducible abdominal pain  Medical Decision Making  Medically screening exam initiated at 7:47 PM.  Appropriate orders placed.  Vertis Vantil was informed that the remainder of the evaluation will be completed by another provider, this initial triage assessment does not replace that evaluation, and the importance of remaining in the ED until their evaluation is complete.     Shermon Warren SAILOR, PA-C 04/30/24 1949

## 2024-04-30 NOTE — ED Triage Notes (Signed)
 Patient arrives via [facility] for back pain. Patient was outside walking and had sudden onset of mid lower back pain. Patient had positive LOC from pain. Hx of kidney stones and POTS. Alert and oriented x4.   BP 180/120 HR 110 20 L hand  50mcg of fentanyl   4mg  zofran 

## 2024-05-01 DIAGNOSIS — M545 Low back pain, unspecified: Secondary | ICD-10-CM | POA: Diagnosis not present

## 2024-05-01 MED ORDER — DEXAMETHASONE SODIUM PHOSPHATE 10 MG/ML IJ SOLN
10.0000 mg | Freq: Once | INTRAMUSCULAR | Status: AC
  Administered 2024-05-01: 10 mg via INTRAVENOUS
  Filled 2024-05-01: qty 1

## 2024-05-01 MED ORDER — METHOCARBAMOL 500 MG PO TABS: 500.0000 mg | ORAL_TABLET | Freq: Two times a day (BID) | ORAL | 0 refills | Status: AC

## 2024-05-01 MED ORDER — MELOXICAM 7.5 MG PO TABS: 7.5000 mg | ORAL_TABLET | Freq: Every day | ORAL | 0 refills | Status: AC

## 2024-05-01 NOTE — Discharge Instructions (Signed)
 Your workup was reassuring. Your pain appears to be consistent with a musculoskeletal strain.  I have prescribed Robaxin  which is a muscle relaxer.  As discussed this may cause drowsiness and should not be taken if you plan to operate a motor vehicle or go to work.  I also prescribed meloxicam  which is to be taken once daily, preferably with a meal.  Do not take ibuprofen  or Aleve  while taking the meloxicam .  Follow-up with primary care for further evaluation.

## 2024-05-01 NOTE — ED Provider Notes (Signed)
  Physical Exam  BP 111/69   Pulse 83   Temp 99 F (37.2 C) (Oral)   Resp 20   Ht 5' 6 (1.676 m)   Wt 95.3 kg   SpO2 100%   BMI 33.89 kg/m   Physical Exam  Procedures  Procedures  ED Course / MDM    Medical Decision Making Amount and/or Complexity of Data Reviewed Labs: ordered.  Risk Prescription drug management.   Patient care assumed at shift handoff from previous provider.  Please see the resident's note for full details.  In short patient with 1 week history of worsening right lower back pain.  She states that the pain has been there for approximately 1 week.  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  She thought it may be musculoskeletal initially but also has a history of kidney stones.  Plan to evaluate CT renal stone study for possible stone.  If no stone plan to discharge home with likely musculoskeletal pain.    1. No acute abnormality in the abdomen or pelvis.  2. Bilateral nonobstructing nephrolithiasis.  No hydronephrosis.  3. 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   Patient treated with dose of Decadron  in addition to previously administered medications. Patient with apparent musculoskeletal strain. Plan to discharge home on meloxicam  and robaxin . Patient advised to not use robaxin  if driving. Patient stable for discharge home.     Frances Reed 05/01/24 [id number]    Palumbo, April, MD 05/01/24 [phone]

## 2024-06-29 ENCOUNTER — Other Ambulatory Visit: Payer: Self-pay

## 2024-06-29 DIAGNOSIS — E039 Hypothyroidism, unspecified: Secondary | ICD-10-CM
